# Patient Record
Sex: Male | Born: 1945 | Race: White | Hispanic: No | Marital: Married | Smoking: Current every day smoker
Health system: Southern US, Community
[De-identification: ages and names within clinical notes are randomized; demographics above are authoritative.]

## PROBLEM LIST (undated history)

## (undated) DIAGNOSIS — R351 Nocturia: Secondary | ICD-10-CM

## (undated) DIAGNOSIS — N411 Chronic prostatitis: Secondary | ICD-10-CM

## (undated) DIAGNOSIS — Z860100 Personal history of colon polyps, unspecified: Secondary | ICD-10-CM

## (undated) DIAGNOSIS — B009 Herpesviral infection, unspecified: Secondary | ICD-10-CM

## (undated) DIAGNOSIS — E049 Nontoxic goiter, unspecified: Secondary | ICD-10-CM

## (undated) DIAGNOSIS — J449 Chronic obstructive pulmonary disease, unspecified: Secondary | ICD-10-CM

## (undated) DIAGNOSIS — N400 Enlarged prostate without lower urinary tract symptoms: Secondary | ICD-10-CM

## (undated) DIAGNOSIS — Z87898 Personal history of other specified conditions: Secondary | ICD-10-CM

## (undated) DIAGNOSIS — I700 Atherosclerosis of aorta: Secondary | ICD-10-CM

## (undated) DIAGNOSIS — H269 Unspecified cataract: Secondary | ICD-10-CM

## (undated) DIAGNOSIS — I10 Essential (primary) hypertension: Secondary | ICD-10-CM

## (undated) DIAGNOSIS — C4491 Basal cell carcinoma of skin, unspecified: Secondary | ICD-10-CM

## (undated) DIAGNOSIS — R972 Elevated prostate specific antigen [PSA]: Secondary | ICD-10-CM

## (undated) DIAGNOSIS — E785 Hyperlipidemia, unspecified: Secondary | ICD-10-CM

## (undated) DIAGNOSIS — H8309 Labyrinthitis, unspecified ear: Secondary | ICD-10-CM

## (undated) HISTORY — DX: Nontoxic goiter, unspecified: E04.9

## (undated) HISTORY — DX: Basal cell carcinoma of skin, unspecified: C44.91

## (undated) HISTORY — DX: Nocturia: R35.1

## (undated) HISTORY — DX: Chronic prostatitis: N41.1

## (undated) HISTORY — DX: Hyperlipidemia, unspecified: E78.5

## (undated) HISTORY — DX: Elevated prostate specific antigen (PSA): R97.20

## (undated) HISTORY — DX: Herpesviral infection, unspecified: B00.9

## (undated) HISTORY — PX: EYE SURGERY: SHX253

## (undated) HISTORY — DX: Unspecified cataract: H26.9

## (undated) HISTORY — DX: Personal history of colon polyps, unspecified: Z86.0100

## (undated) HISTORY — PX: SKIN CANCER DESTRUCTION: SHX778

## (undated) HISTORY — DX: Personal history of colonic polyps: Z86.010

## (undated) HISTORY — PX: TONSILLECTOMY: SUR1361

## (undated) HISTORY — DX: Benign prostatic hyperplasia without lower urinary tract symptoms: N40.0

---

## 2006-02-03 ENCOUNTER — Ambulatory Visit: Payer: Self-pay | Admitting: Unknown Physician Specialty

## 2008-12-22 ENCOUNTER — Ambulatory Visit: Payer: Self-pay | Admitting: Internal Medicine

## 2009-08-15 ENCOUNTER — Ambulatory Visit: Payer: Self-pay | Admitting: Internal Medicine

## 2010-12-24 ENCOUNTER — Ambulatory Visit: Payer: Self-pay | Admitting: Gastroenterology

## 2010-12-27 LAB — PATHOLOGY REPORT

## 2012-09-23 ENCOUNTER — Ambulatory Visit: Payer: Self-pay | Admitting: Internal Medicine

## 2013-10-27 ENCOUNTER — Ambulatory Visit: Payer: Self-pay | Admitting: Ophthalmology

## 2013-11-17 ENCOUNTER — Ambulatory Visit: Payer: Self-pay | Admitting: Ophthalmology

## 2014-02-27 DIAGNOSIS — H8309 Labyrinthitis, unspecified ear: Secondary | ICD-10-CM | POA: Insufficient documentation

## 2014-08-16 ENCOUNTER — Other Ambulatory Visit: Payer: Self-pay | Admitting: Urology

## 2014-08-16 DIAGNOSIS — N401 Enlarged prostate with lower urinary tract symptoms: Principal | ICD-10-CM

## 2014-08-16 DIAGNOSIS — N138 Other obstructive and reflux uropathy: Secondary | ICD-10-CM

## 2014-08-17 ENCOUNTER — Ambulatory Visit (INDEPENDENT_AMBULATORY_CARE_PROVIDER_SITE_OTHER): Payer: Medicare Other | Admitting: Urology

## 2014-08-17 ENCOUNTER — Encounter: Payer: Self-pay | Admitting: Urology

## 2014-08-17 VITALS — BP 129/71 | HR 60 | Ht 71.0 in | Wt 176.1 lb

## 2014-08-17 DIAGNOSIS — N401 Enlarged prostate with lower urinary tract symptoms: Secondary | ICD-10-CM | POA: Diagnosis not present

## 2014-08-17 DIAGNOSIS — Z87898 Personal history of other specified conditions: Secondary | ICD-10-CM

## 2014-08-17 DIAGNOSIS — E042 Nontoxic multinodular goiter: Secondary | ICD-10-CM | POA: Insufficient documentation

## 2014-08-17 DIAGNOSIS — N138 Other obstructive and reflux uropathy: Secondary | ICD-10-CM

## 2014-08-17 DIAGNOSIS — C4491 Basal cell carcinoma of skin, unspecified: Secondary | ICD-10-CM | POA: Insufficient documentation

## 2014-08-17 NOTE — Progress Notes (Signed)
08/17/2014 9:49 AM   Ryan Mejia 03-20-1945 [id number]  Referring provider: No referring provider defined for this encounter.  Chief Complaint  Patient presents with  . Benign Prostatic Hypertrophy    6 month follow up  . Nocturia    HPI: Ryan Mejia is a 69 year old white male with BPH with LUTS and a history of elevated PSA who is managed with tamsulosin and finasteride who presents for a 6 month follow up.  His current urinary symptoms consist of urinary frequency, weak stream, straining, and nocturia. He has nocturia that is sporadic happening 1-5 times nightly. He is currently pleased with his urinary symptoms. His IPSS score is 6/1. (mild)  He has not had any fevers, chills, nausea, vomiting, suprapubic pain, lower back pain or gross hematuria since his last visit with [place] 6 months ago.  His father was diagnosed in his 31's with PCa.   PSA History:    5.77 ng/mL on 04/06/2013    3.00 ng/mL on 08/09/2013      IPSS      08/17/14 1300       International Prostate Symptom Score   How often have you had the sensation of not emptying your bladder? Not at All     How often have you had to urinate less than every two hours? Less than half the time     How often have you found you stopped and started again several times when you urinated? Not at All     How often have you found it difficult to postpone urination? Not at All     How often have you had a weak urinary stream? Less than 1 in 5 times     How often have you had to strain to start urination? Less than 1 in 5 times     How many times did you typically get up at night to urinate? 1 Time     Total IPSS Score 5     Quality of Life due to urinary symptoms   If you were to spend the rest of your life with your urinary condition just the way it is now how would you feel about that? Pleased         PMH: Past Medical History  Diagnosis Date  . History of colonic polyps   . Hyperlipidemia   . Goiter   . Skin cancer,  basal cell   . Herpes simplex   . Cataracts, bilateral   . Nocturia   . BPH (benign prostatic hyperplasia)   . Chronic prostatitis   . Elevated PSA     Surgical History: No past surgical history on file.  Home Medications:    Medication List       This list is accurate as of: 08/17/14 11:59 PM.  Always use your most recent med list.               acyclovir 200 MG capsule  Commonly known as:  ZOVIRAX  Take 200 mg by mouth 5 (five) times daily.     aspirin 81 MG tablet  Take 81 mg by mouth daily.     finasteride 5 MG tablet  Commonly known as:  PROSCAR  Take 5 mg by mouth daily.     loratadine 10 MG tablet  Commonly known as:  CLARITIN  Take 10 mg by mouth daily.     PROBIOTIC ADVANCED Caps  Take by mouth.     tamsulosin 0.4 MG Caps capsule  Commonly known as:  FLOMAX  Take 0.4 mg by mouth.        Allergies:  Allergies  Allergen Reactions  . Lipitor [Atorvastatin]     Muscle aches    Family History: Family History  Problem Relation Age of Onset  . Prostate cancer Father   . Cancer Mother     breast cancer    Social History:  reports that he has been smoking Cigarettes.  He has a 20 pack-year smoking history. He does not have any smokeless tobacco history on file. He reports that he drinks about 1.2 oz of alcohol per week. He reports that he does not use illicit drugs.  ROS: Urological Symptom Review  Patient is experiencing the following symptoms: Frequent urination Get up at night to urinate Weak stream   Review of Systems  Gastrointestinal (upper)  : Negative for upper GI symptoms  Gastrointestinal (lower) : Negative for lower GI symptoms  Constitutional : Negative for symptoms  Skin: Negative for skin symptoms  Eyes: Negative for eye symptoms  Ear/Nose/Throat : Negative for Ear/Nose/Throat symptoms  Hematologic/Lymphatic: Negative for Hematologic/Lymphatic symptoms  Cardiovascular : Negative for cardiovascular  symptoms  Respiratory : Negative for respiratory symptoms  Endocrine: Negative for endocrine symptoms  Musculoskeletal: Back pain  Neurological: Negative for neurological symptoms  Psychologic: Negative for psychiatric symptoms   Physical Exam: BP 129/71 mmHg  Pulse 60  Ht 5\' 11"  (1.803 m)  Wt 176 lb 1.6 oz (79.878 kg)  BMI 24.57 kg/m2  GU: Patient with circumcised phallus.   Urethral meatus is patent.  No penile discharge. No penile lesions or rashes. Scrotum without lesions, cysts, rashes and/or edema.  Testicles are located scrotally bilaterally. No masses are appreciated in the testicles. Left and right epididymis are normal.  Rectal: Patient with  normal sphincter tone. Perineum without scarring or rashes. No rectal masses are appreciated. Prostate is approximately 60 grams, no nodules are appreciated. Seminal vesicles are normal.   Laboratory Data: Results for orders placed or performed in visit on 08/17/14  PSA  Result Value Ref Range   Prostate Specific Ag, Serum 1.9 0.0 - 4.0 ng/mL   No results found for: WBC, HGB, HCT, MCV, PLT  No results found for: CREATININE  No results found for: PSA  No results found for: TESTOSTERONE  No results found for: HGBA1C  Urinalysis No results found for: COLORURINE, APPEARANCEUR, LABSPEC, PHURINE, GLUCOSEU, HGBUR, BILIRUBINUR, KETONESUR, PROTEINUR, UROBILINOGEN, NITRITE, LEUKOCYTESUR  Pertinent Imaging:   Assessment & Plan:   1. BPH with obstruction/lower urinary tract symptoms:  Patient's IPSS score is 6/1(mild).  He is pleased with his urinary symptoms.  His DRE demonstrates an enlarged prostate.  We will continue tamsulosin and finasteride.  He will follow up in 6 months for a PSA, DRE, PVR and an IPSS.    - PSA  2. History of elevated PSA:  Patient was referred to [place] 1 year ago for PSA of 5.77 ng/mL by his primary care physician. His most recent PSA was 3.0 ng/mL on 08/09/2013 after a 3 month course of antibiotics.   He was initiated on finasteride at the June visit. We will continue to follow him closely with PSA's and DRE's every 6 months until a stable trend is achieved.   No Follow-up on file.  Ryan Mejia, [facility] [address], [place], [place] [address] [phone]

## 2014-08-18 LAB — PSA: PROSTATE SPECIFIC AG, SERUM: 1.9 ng/mL (ref 0.0–4.0)

## 2014-08-19 ENCOUNTER — Telehealth: Payer: Self-pay

## 2014-08-19 NOTE — Telephone Encounter (Signed)
-----   Message from Nori Riis, PA-C sent at 08/19/2014  8:44 AM EDT ----- PSA is stable.  We will see him 6 months.

## 2014-08-19 NOTE — Telephone Encounter (Signed)
Spoke with pt and made aware of PSA. Pt will f/u in 22mo. Cw,lpn

## 2014-08-22 DIAGNOSIS — Z87898 Personal history of other specified conditions: Secondary | ICD-10-CM | POA: Insufficient documentation

## 2014-08-22 DIAGNOSIS — N138 Other obstructive and reflux uropathy: Secondary | ICD-10-CM | POA: Insufficient documentation

## 2014-08-22 DIAGNOSIS — N401 Enlarged prostate with lower urinary tract symptoms: Secondary | ICD-10-CM

## 2015-01-20 ENCOUNTER — Other Ambulatory Visit: Payer: Self-pay | Admitting: Internal Medicine

## 2015-01-20 DIAGNOSIS — R0602 Shortness of breath: Secondary | ICD-10-CM

## 2015-01-27 ENCOUNTER — Ambulatory Visit
Admission: RE | Admit: 2015-01-27 | Discharge: 2015-01-27 | Disposition: A | Discharge status: Home / Self Care | Payer: Medicare Other | Source: Ambulatory Visit | Attending: Internal Medicine | Admitting: Internal Medicine

## 2015-01-27 DIAGNOSIS — R0602 Shortness of breath: Secondary | ICD-10-CM | POA: Insufficient documentation

## 2015-01-27 DIAGNOSIS — J439 Emphysema, unspecified: Secondary | ICD-10-CM | POA: Diagnosis not present

## 2015-01-27 DIAGNOSIS — D3502 Benign neoplasm of left adrenal gland: Secondary | ICD-10-CM | POA: Insufficient documentation

## 2015-01-27 DIAGNOSIS — E041 Nontoxic single thyroid nodule: Secondary | ICD-10-CM | POA: Diagnosis not present

## 2015-01-27 DIAGNOSIS — D3501 Benign neoplasm of right adrenal gland: Secondary | ICD-10-CM | POA: Insufficient documentation

## 2015-02-13 ENCOUNTER — Other Ambulatory Visit: Payer: Medicare Other

## 2015-02-13 DIAGNOSIS — R972 Elevated prostate specific antigen [PSA]: Secondary | ICD-10-CM

## 2015-02-14 LAB — PSA: PROSTATE SPECIFIC AG, SERUM: 1.7 ng/mL (ref 0.0–4.0)

## 2015-02-15 ENCOUNTER — Encounter: Payer: Self-pay | Admitting: Urology

## 2015-02-15 ENCOUNTER — Ambulatory Visit (INDEPENDENT_AMBULATORY_CARE_PROVIDER_SITE_OTHER): Payer: Medicare Other | Admitting: Urology

## 2015-02-15 ENCOUNTER — Ambulatory Visit: Payer: Medicare Other | Admitting: Urology

## 2015-02-15 VITALS — BP 120/69 | HR 83 | Ht 72.0 in | Wt 182.6 lb

## 2015-02-15 DIAGNOSIS — N401 Enlarged prostate with lower urinary tract symptoms: Secondary | ICD-10-CM

## 2015-02-15 DIAGNOSIS — Z87898 Personal history of other specified conditions: Secondary | ICD-10-CM

## 2015-02-15 DIAGNOSIS — N138 Other obstructive and reflux uropathy: Secondary | ICD-10-CM

## 2015-02-15 MED ORDER — TAMSULOSIN HCL 0.4 MG PO CAPS: 0.4000 mg | ORAL_CAPSULE | Freq: Every day | ORAL | Status: DC

## 2015-02-15 MED ORDER — FINASTERIDE 5 MG PO TABS: 5.0000 mg | ORAL_TABLET | Freq: Every day | ORAL | Status: DC

## 2015-02-15 NOTE — Progress Notes (Signed)
8:Ryan Mejia 1946-01-11 [id number]  Referring provider: Idelle Crouch, MD [facility] [place], [place] [address]  Chief Complaint  Patient presents with  . Elevated PSA    6 month followup  . Benign Prostatic Hypertrophy    HPI: Patient is a 69 year old white male with a history of elevated PSA and BPH with LUTS who presents today for a 6 month follow up.  BPH WITH LUTS His IPSS score today is 13, which is moderate lower urinary tract symptomatology. He is mostly dissatisfied with his quality life due to his urinary symptoms.  His major complaint today is worsening of his urinary symptoms.  He has had these symptoms for the last 6 to 8 weeks.  He has noticed that his finasteride medication looks different from his previous prescription.  He believes that this may be the cause of his worsening urinary symptoms.    He denies any dysuria, hematuria or suprapubic pain.   He also denies any recent fevers, chills, nausea or vomiting.   He has a family history of PCa, with his father being diagnosed with prostate cancer.        IPSS      02/15/15 1600       International Prostate Symptom Score   How often have you had the sensation of not emptying your bladder? Less than 1 in 5     How often have you had to urinate less than every two hours? More than half the time     How often have you found you stopped and started again several times when you urinated? Not at All     How often have you found it difficult to postpone urination? Less than 1 in 5 times     How often have you had a weak urinary stream? More than half the time     How often have you had to strain to start urination? Not at All     How many times did you typically get up at night to urinate? 3 Times     Total IPSS Score 13     Quality of Life due to urinary symptoms   If you were to spend the rest of your life with your urinary condition just the way it is now how would you feel  about that? Mostly Disatisfied        Score:  1-7 Mild 8-19 Moderate 20-35 Severe   History of elevated  PSA Patient was referred to [place] in 2015  for PSA of 5.77 ng/mL by his primary care physician.  He was initiated on finasteride at that time.  His most recent PSA was 1.7 ng/mL on 02/13/2015.     PMH: Past Medical History  Diagnosis Date  . History of colonic polyps   . Hyperlipidemia   . Goiter   . Skin cancer, basal cell   . Herpes simplex   . Cataracts, bilateral   . Nocturia   . BPH (benign prostatic hyperplasia)   . Chronic prostatitis   . Elevated PSA     Surgical History: Past Surgical History  Procedure Laterality Date  . Tonsillectomy      child    Home Medications:    Medication List       This list is accurate as of: 02/15/15 11:59 PM.  Always use your most recent med list.  acyclovir 200 MG capsule  Commonly known as:  ZOVIRAX  Take 200 mg by mouth 5 (five) times daily. Reported on 02/15/2015     acyclovir 400 MG tablet  Commonly known as:  ZOVIRAX     aspirin 81 MG tablet  Take 81 mg by mouth daily. Reported on 02/15/2015     finasteride 5 MG tablet  Commonly known as:  PROSCAR  Take 1 tablet (5 mg total) by mouth daily.     loratadine 10 MG tablet  Commonly known as:  CLARITIN  Take 10 mg by mouth daily.     PROBIOTIC ADVANCED Caps  Take by mouth.     tamsulosin 0.4 MG Caps capsule  Commonly known as:  FLOMAX  Take 1 capsule (0.4 mg total) by mouth daily.        Allergies:  Allergies  Allergen Reactions  . Lipitor [Atorvastatin]     Muscle aches    Family History: Family History  Problem Relation Age of Onset  . Prostate cancer Father   . Breast cancer Mother   . Kidney disease Neg Hx     Social History:  reports that he has been smoking Cigarettes.  He has a 20 pack-year smoking history. He does not have any smokeless tobacco history on file. He reports that he drinks about 1.2 oz of alcohol per  week. He reports that he does not use illicit drugs.  ROS: Urological Symptom Review  Patient is experiencing the following symptoms: Frequent urination Get up at night to urinate Weak stream   Review of Systems  Gastrointestinal (upper)  : Negative for upper GI symptoms  Gastrointestinal (lower) : Negative for lower GI symptoms  Constitutional : Negative for symptoms  Skin: Negative for skin symptoms  Eyes: Negative for eye symptoms  Ear/Nose/Throat : Negative for Ear/Nose/Throat symptoms  Hematologic/Lymphatic: Negative for Hematologic/Lymphatic symptoms  Cardiovascular : Negative for cardiovascular symptoms  Respiratory : Negative for respiratory symptoms  Endocrine: Negative for endocrine symptoms  Musculoskeletal: Back pain  Neurological: Negative for neurological symptoms  Psychologic: Negative for psychiatric symptoms   Physical Exam: BP 120/69 mmHg  Pulse 83  Ht 6' (1.829 m)  Wt 182 lb 9.6 oz (82.827 kg)  BMI 24.76 kg/m2  GU: Patient with circumcised phallus.   Urethral meatus is patent.  No penile discharge. No penile lesions or rashes. Scrotum without lesions, cysts, rashes and/or edema.  Testicles are located scrotally bilaterally. No masses are appreciated in the testicles. Left and right epididymis are normal. Rectal: Patient with  normal sphincter tone. Perineum without scarring or rashes. No rectal masses are appreciated. Prostate is approximately 60 grams, no nodules are appreciated. Seminal vesicles are normal.   Laboratory Data: PSA History:    5.77 ng/mL on 04/06/2013    3.00 ng/mL on 08/09/2013  Lab Results  Component Value Date   PSA 1.7 02/13/2015   PSA 1.9 08/17/2014   Pertinent Imaging:   Assessment & Plan:   1. BPH with obstruction/lower urinary tract symptoms:  Patient's IPSS score is 13/4.  He is mostly dissatisfied with his urinary symptoms at this time.  We have contacted his pharmacy and they have changed  manufacturers.  His pharmacy has stated that he cannot go back to the original generic manufacturers.  I will prescribe branded Proscar and Flomax.  Patient will contact us regarding his ability to fill those scripts.   He will follow up in 6 months for a PSA, exam and an IPSS score.  2. History of elevated PSA:  Patient was referred to [place] 1 year ago for PSA of 5.77 ng/mL by his primary care physician. His most recent PSA was 1.7 ng/mL on 02/13/2015.   He was initiated on finasteride at the June 2016 visit. We will continue to follow him closely with PSA's and DRE's every 6 months until a stable trend is achieved.   Return in about 6 months (around 08/16/2015) for IPSS score and exam.  Zara Council, [facility] [address], [place], [place] [address] [phone]

## 2015-03-04 ENCOUNTER — Ambulatory Visit
Admission: EM | Admit: 2015-03-04 | Discharge: 2015-03-04 | Disposition: A | Discharge status: Home / Self Care | Payer: Medicare Other | Attending: Family Medicine | Admitting: Family Medicine

## 2015-03-04 ENCOUNTER — Encounter: Payer: Self-pay | Admitting: Emergency Medicine

## 2015-03-04 DIAGNOSIS — J069 Acute upper respiratory infection, unspecified: Secondary | ICD-10-CM

## 2015-03-04 LAB — RAPID STREP SCREEN (MED CTR MEBANE ONLY): Streptococcus, Group A Screen (Direct): NEGATIVE

## 2015-03-04 MED ORDER — AZITHROMYCIN 250 MG PO TABS
ORAL_TABLET | ORAL | Status: DC
Start: 2015-03-04 — End: 2015-08-17

## 2015-03-04 NOTE — ED Provider Notes (Signed)
Patient presents today with symptoms of nasal congestion, sore throat, mild productive cough. He states that he has had the symptoms for the last 4 days. He denies any high fever, chest pain, shortness of breath, nausea, vomiting, abdominal pain, severe headache. He does smoke. He denies any history of asthma or COPD.  ROS: Negative except mentioned above.  Vitals as per Epic. GENERAL: NAD HEENT: mild pharyngeal erythema, no exudate, +cerumen bilaterally, no significant cervical LAD RESP: CTA B CARD: RRR NEURO: CN II-XII grossly intact   A/P: URI, Nasal Congestion- Will treat with Z-Pak, Claritin when necessary, Delsym when necessary, rest, hydration, seek medical attention if symptoms persist or worsen as discussed. Encouraged smoking cessation.   Paulina Fusi, MD 03/04/15 [phone]

## 2015-03-04 NOTE — ED Notes (Signed)
Patient c/o runny nose, sinus pressure, cough and chest congestion and sore throat since Wed.  Patient denies fevers.

## 2015-03-06 LAB — CULTURE, GROUP A STREP (THRC)

## 2015-06-03 ENCOUNTER — Encounter: Payer: Self-pay | Admitting: Gynecology

## 2015-06-03 ENCOUNTER — Ambulatory Visit
Admission: EM | Admit: 2015-06-03 | Discharge: 2015-06-03 | Disposition: A | Discharge status: Home / Self Care | Payer: Medicare Other | Attending: Family Medicine | Admitting: Family Medicine

## 2015-06-03 DIAGNOSIS — J111 Influenza due to unidentified influenza virus with other respiratory manifestations: Secondary | ICD-10-CM | POA: Diagnosis not present

## 2015-06-03 DIAGNOSIS — Z720 Tobacco use: Secondary | ICD-10-CM | POA: Diagnosis not present

## 2015-06-03 LAB — RAPID INFLUENZA A&B ANTIGENS: Influenza A (ARMC): NEGATIVE

## 2015-06-03 LAB — RAPID STREP SCREEN (MED CTR MEBANE ONLY): STREPTOCOCCUS, GROUP A SCREEN (DIRECT): NEGATIVE

## 2015-06-03 LAB — RAPID INFLUENZA A&B ANTIGENS (ARMC ONLY): INFLUENZA B (ARMC): NEGATIVE

## 2015-06-03 MED ORDER — FEXOFENADINE-PSEUDOEPHED ER 180-240 MG PO TB24: 1.0000 | ORAL_TABLET | Freq: Every day | ORAL | Status: DC

## 2015-06-03 MED ORDER — OSELTAMIVIR PHOSPHATE 75 MG PO CAPS: 75.0000 mg | ORAL_CAPSULE | Freq: Two times a day (BID) | ORAL | Status: DC

## 2015-06-03 MED ORDER — MELOXICAM 15 MG PO TABS: 15.0000 mg | ORAL_TABLET | Freq: Every day | ORAL | Status: DC

## 2015-06-03 MED ORDER — HYDROCOD POLST-CPM POLST ER 10-8 MG/5ML PO SUER: 5.0000 mL | Freq: Two times a day (BID) | ORAL | Status: DC | PRN

## 2015-06-03 NOTE — ED Notes (Signed)
Patient c/o body cache / low grade fever / fever this am of 100.1 / cough and head ache

## 2015-06-03 NOTE — ED Provider Notes (Signed)
CSN: [id number]     Arrival date & time 06/03/15  [id number] History   First MD Initiated Contact with Patient 06/03/15 [phone]    Nurses notes were reviewed. Chief Complaint  Patient presents with  . Cough  . Generalized Body Aches      Patient reports slight cough Thursday night aches started on Friday along the coughing sore throat overall general malaise. He received his flu shot early in the year  Does have a history of smoking. No pertinent family medical history but he's had history of colonic polyps skin cancers herpes simplex and BPH and chronic prostatitis.    (Consider location/radiation/quality/duration/timing/severity/associated sxs/prior Treatment) Patient is a 70 y.o. male presenting with cough. The history is provided by the patient. No language interpreter was used.  Cough Cough characteristics:  Productive Severity:  Moderate Duration:  2 days Progression:  Worsening Chronicity:  New Context: upper respiratory infection   Associated symptoms: chills, ear pain, fever, myalgias, rhinorrhea and sore throat   Associated symptoms: no wheezing     Past Medical History  Diagnosis Date  . History of colonic polyps   . Hyperlipidemia   . Goiter   . Skin cancer, basal cell   . Herpes simplex   . Cataracts, bilateral   . Nocturia   . BPH (benign prostatic hyperplasia)   . Chronic prostatitis   . Elevated PSA    Past Surgical History  Procedure Laterality Date  . Tonsillectomy      child   Family History  Problem Relation Age of Onset  . Prostate cancer Father   . Breast cancer Mother   . Kidney disease Neg Hx    Social History  Substance Use Topics  . Smoking status: Current Every Day Smoker -- 0.50 packs/day for 40 years    Types: Cigarettes  . Smokeless tobacco: None  . Alcohol Use: 1.2 oz/week    2 Cans of beer per week    Review of Systems  Constitutional: Positive for fever and chills.  HENT: Positive for congestion, ear pain, rhinorrhea, sinus  pressure and sore throat.   Eyes: Negative for visual disturbance.  Respiratory: Positive for cough. Negative for wheezing.   Musculoskeletal: Positive for myalgias.  All other systems reviewed and are negative.   Allergies  Lipitor  Home Medications   Prior to Admission medications   Medication Sig Start Date End Date Taking? Authorizing Provider  acyclovir (ZOVIRAX) 400 MG tablet  12/06/14  Yes Historical Provider, MD  aspirin 81 MG tablet Take 81 mg by mouth daily. Reported on 02/15/2015   Yes Historical Provider, MD  finasteride (PROSCAR) 5 MG tablet Take 1 tablet (5 mg total) by mouth daily. 02/15/15  Yes Shannon A McGowan, PA-C  loratadine (CLARITIN) 10 MG tablet Take 10 mg by mouth daily.   Yes Historical Provider, MD  Probiotic Product (PROBIOTIC ADVANCED) CAPS Take by mouth.   Yes Historical Provider, MD  tamsulosin (FLOMAX) 0.4 MG CAPS capsule Take 1 capsule (0.4 mg total) by mouth daily. 02/15/15  Yes Shannon A McGowan, PA-C  acyclovir (ZOVIRAX) 200 MG capsule Take 200 mg by mouth 5 (five) times daily. Reported on 02/15/2015    Historical Provider, MD  azithromycin (ZITHROMAX Z-PAK) 250 MG tablet Use as directed for 5 days. 03/04/15   Paulina Fusi, MD  chlorpheniramine-HYDROcodone (TUSSIONEX PENNKINETIC ER) 10-8 MG/5ML SUER Take 5 mLs by mouth every 12 (twelve) hours as needed for cough. 06/03/15   Frederich Cha, MD  fexofenadine-pseudoephedrine (ALLEGRA-D ALLERGY & CONGESTION)  180-240 MG 24 hr tablet Take 1 tablet by mouth daily. 06/03/15   Frederich Cha, MD  meloxicam (MOBIC) 15 MG tablet Take 1 tablet (15 mg total) by mouth daily. 06/03/15   Frederich Cha, MD  oseltamivir (TAMIFLU) 75 MG capsule Take 1 capsule (75 mg total) by mouth 2 (two) times daily. 06/03/15   Frederich Cha, MD   Meds Ordered and Administered this Visit  Medications - No data to display  BP 134/64 mmHg  Pulse 84  Temp(Src) 100 F (37.8 C) (Oral)  Resp 16  Ht 6' (1.829 m)  Wt 170 lb (77.111 kg)  BMI 23.05 kg/m2   SpO2 99% No data found.   Physical Exam  Constitutional: He is oriented to person, place, and time. He appears well-developed and well-nourished.  HENT:  Head: Normocephalic and atraumatic.  Right Ear: External ear normal.  Left Ear: External ear normal.  Eyes: Conjunctivae are normal. Pupils are equal, round, and reactive to light.  Neck: Normal range of motion. Neck supple.  Cardiovascular: Normal rate, regular rhythm and normal heart sounds.   Pulmonary/Chest: Effort normal and breath sounds normal.  Musculoskeletal: Normal range of motion. He exhibits no edema.  Neurological: He is alert and oriented to person, place, and time.  Skin: Skin is warm and dry.  Psychiatric: He has a normal mood and affect.  Vitals reviewed.   ED Course  Procedures (including critical care time)  Labs Review Labs Reviewed  RAPID INFLUENZA A&B ANTIGENS ([facility] ONLY)  RAPID STREP SCREEN (NOT AT [facility])  CULTURE, GROUP A STREP [facility])    Imaging Review No results found.   Visual Acuity Review  Right Eye Distance:   Left Eye Distance:   Bilateral Distance:    Right Eye Near:   Left Eye Near:    Bilateral Near:      Results for orders placed or performed during the hospital encounter of 06/03/15  Rapid Influenza A&B Antigens ([facility] only)  Result Value Ref Range   Influenza A ([facility]) NEGATIVE NEGATIVE   Influenza B ([facility]) NEGATIVE NEGATIVE  Rapid strep screen  Result Value Ref Range   Streptococcus, Group A Screen (Direct) NEGATIVE NEGATIVE     MDM   1. Flu syndrome   2. Tobacco abuse    Patient symptoms. 20-36 hours flu test was negative but will see Tamiflu 75 mg twice a day plus next 1 teaspoon twice a day Allegra-D decongestant daily and Mobic 50 mg daily. He inquires whether he can take aspirin strongly discouraged aspirin use and use of muscle aches and pain. Can also use Tylenol if needed and follow-up Dr. Doy Hutching not better in 3-4 days. *Encouraged him to stop smoking  deformity does not need a work note.   Note: This dictation was prepared with Dragon dictation along with smaller phrase technology. Any transcriptional errors that result from this process are unintentional.   Frederich Cha, MD 06/03/15 [phone]

## 2015-06-03 NOTE — Discharge Instructions (Signed)
Influenza, Adult Influenza (flu) is an infection in the mouth, nose, and throat (respiratory tract) caused by a virus. The flu can make you feel very ill. Influenza spreads easily from person to person (contagious).  HOME CARE   Only take medicines as told by your doctor.  Use a cool mist humidifier to make breathing easier.  Get plenty of rest until your fever goes away. This usually takes 3 to 4 days.  Drink enough fluids to keep your pee (urine) clear or pale yellow.  Cover your mouth and nose when you cough or sneeze.  Wash your hands well to avoid spreading the flu.  Stay home from work or school until your fever has been gone for at least 1 full day.  Get a flu shot every year. GET HELP RIGHT AWAY IF:   You have trouble breathing or feel short of breath.  Your skin or nails turn blue.  You have severe neck pain or stiffness.  You have a severe headache, facial pain, or earache.  Your fever gets worse or keeps coming back.  You feel sick to your stomach (nauseous), throw up (vomit), or have watery poop (diarrhea).  You have chest pain.  You have a deep cough that gets worse, or you cough up more thick spit (mucus). MAKE SURE YOU:   Understand these instructions.  Will watch your condition.  Will get help right away if you are not doing well or get worse.   This information is not intended to replace advice given to you by your health care provider. Make sure you discuss any questions you have with your health care provider.   Document Released: 11/28/2007 Document Revised: 03/11/2014 Document Reviewed: 05/20/2011 Elsevier Interactive Patient Education 2016 Elsevier Inc. Influenza Tests WHY AM I HAVING THIS TEST? You may have an influenza test to help your health care provider determine what type of respiratory infection you have. The test may also be used to help determine a treatment plan and to monitor influenza activity within a community. There are two  types of influenza virus: types A and B. Often, one strain of type A influenza will be the most common type of influenza in a community during flu season. This is typically between the months of October and May. Influenza tests can help determine which strain of influenza type A is occurring most often in the community. WHAT KIND OF SAMPLE IS TAKEN? Influenza tests are performed by collecting a small sample of fluids (secretions) from your nose or throat using a cotton swab. Tests performed on nasal secretions are more accurate than tests performed on a sample taken from your throat.  Rapid influenza tests are available and have become the most frequently used tests for influenza. They are most accurate when completed within the first 48 hours after your symptoms begin.  Depending on the method, a rapid influenza test may be completed in your health care provider's office in less than 30 minutes. It can also be sent to a lab with the results available the same day.  Depending on the particular type of test used, it can identify influenza type A, a mixture of types A and B, or differentiate between type A and B.  Another test that your health care provider may order is a viral culture. This also requires the collection of secretions from your nose or throat. The sample is then sent to a lab for processing. This may take several days to complete. HOW ARE YOUR TEST RESULTS  REPORTED? Your test results will be reported as either positive or negative. A false-negative result can occur. A false-negative result is incorrect because it indicates a condition or finding is not present when it is. It is your responsibility to obtain your test results. Ask the lab or department performing the test when and how you will get your results. WHAT DO THE RESULTS MEAN?  A positive test means you have influenza. Tests may further determine the type of influenza you have.  A negative influenza test result means it is  not likely that you have influenza.  A false-negative result can occur. False-negative results are more likely to happen at the height of the influenza season. Talk with your health care provider to discuss your results, treatment options, and if necessary, the need for more tests. Talk with your health care provider if you have any questions about your results.   This information is not intended to replace advice given to you by your health care provider. Make sure you discuss any questions you have with your health care provider.   Document Released: 11/28/2004 Document Revised: 03/11/2014 Document Reviewed: 07/07/2013 Elsevier Interactive Patient Education [employer].

## 2015-06-06 LAB — CULTURE, GROUP A STREP (THRC)

## 2015-07-06 ENCOUNTER — Other Ambulatory Visit: Payer: Self-pay | Admitting: Internal Medicine

## 2015-07-06 DIAGNOSIS — M542 Cervicalgia: Principal | ICD-10-CM

## 2015-07-06 DIAGNOSIS — G8929 Other chronic pain: Secondary | ICD-10-CM

## 2015-07-26 ENCOUNTER — Other Ambulatory Visit: Payer: Self-pay | Admitting: Internal Medicine

## 2015-07-26 ENCOUNTER — Ambulatory Visit
Admission: RE | Admit: 2015-07-26 | Discharge: 2015-07-26 | Disposition: A | Discharge status: Home / Self Care | Payer: Medicare Other | Source: Ambulatory Visit | Attending: Internal Medicine | Admitting: Internal Medicine

## 2015-07-26 DIAGNOSIS — M50322 Other cervical disc degeneration at C5-C6 level: Secondary | ICD-10-CM | POA: Diagnosis not present

## 2015-07-26 DIAGNOSIS — M47812 Spondylosis without myelopathy or radiculopathy, cervical region: Secondary | ICD-10-CM | POA: Diagnosis not present

## 2015-07-26 DIAGNOSIS — M542 Cervicalgia: Secondary | ICD-10-CM | POA: Insufficient documentation

## 2015-07-26 DIAGNOSIS — G8929 Other chronic pain: Secondary | ICD-10-CM

## 2015-07-26 DIAGNOSIS — M50321 Other cervical disc degeneration at C4-C5 level: Secondary | ICD-10-CM | POA: Insufficient documentation

## 2015-07-26 DIAGNOSIS — M50323 Other cervical disc degeneration at C6-C7 level: Secondary | ICD-10-CM | POA: Diagnosis not present

## 2015-07-26 DIAGNOSIS — E079 Disorder of thyroid, unspecified: Secondary | ICD-10-CM | POA: Diagnosis not present

## 2015-08-02 ENCOUNTER — Ambulatory Visit: Payer: Medicare Other | Attending: Specialist

## 2015-08-02 DIAGNOSIS — G4733 Obstructive sleep apnea (adult) (pediatric): Secondary | ICD-10-CM | POA: Diagnosis not present

## 2015-08-02 DIAGNOSIS — G4761 Periodic limb movement disorder: Secondary | ICD-10-CM | POA: Insufficient documentation

## 2015-08-17 ENCOUNTER — Ambulatory Visit (INDEPENDENT_AMBULATORY_CARE_PROVIDER_SITE_OTHER): Payer: Medicare Other | Admitting: Urology

## 2015-08-17 ENCOUNTER — Encounter: Payer: Self-pay | Admitting: Urology

## 2015-08-17 VITALS — BP 122/76 | HR 67 | Ht 72.0 in | Wt 179.0 lb

## 2015-08-17 DIAGNOSIS — Z8042 Family history of malignant neoplasm of prostate: Secondary | ICD-10-CM

## 2015-08-17 DIAGNOSIS — N138 Other obstructive and reflux uropathy: Secondary | ICD-10-CM

## 2015-08-17 DIAGNOSIS — N401 Enlarged prostate with lower urinary tract symptoms: Secondary | ICD-10-CM

## 2015-08-17 DIAGNOSIS — Z87898 Personal history of other specified conditions: Secondary | ICD-10-CM

## 2015-08-17 NOTE — Progress Notes (Signed)
10:50 AM   Ryan Mejia 03/08/1945 [id number]  Referring provider: Idelle Crouch, MD [place] [facility] [place], [place] [address]  Chief Complaint  Patient presents with  . Follow-up  . Elevated PSA    HPI: Patient is a 70 year old Caucasian male with a history of elevated PSA and BPH with LUTS who presents today for a 6 month follow up.  BPH WITH LUTS His IPSS score today is 16, which is moderate lower urinary tract symptomatology. He is mixed with his quality life due to his urinary symptoms.  His major complaint today is worsening of his urinary symptoms.  He has had these symptoms for the last 6 months.  He did not notice an improvement with the branded finasteride and tamsulosin.  He denies any dysuria, hematuria or suprapubic pain.   He also denies any recent fevers, chills, nausea or vomiting.   He has a family history of PCa, with his father being diagnosed with prostate cancer.        IPSS      08/17/15 1000       International Prostate Symptom Score   How often have you had the sensation of not emptying your bladder? Almost always     How often have you had to urinate less than every two hours? About half the time     How often have you found you stopped and started again several times when you urinated? Not at All     How often have you found it difficult to postpone urination? Less than 1 in 5 times     How often have you had a weak urinary stream? More than half the time     How often have you had to strain to start urination? Less than 1 in 5 times     How many times did you typically get up at night to urinate? 2 Times     Total IPSS Score 16     Quality of Life due to urinary symptoms   If you were to spend the rest of your life with your urinary condition just the way it is now how would you feel about that? Mixed        Score:  1-7 Mild 8-19 Moderate 20-35 Severe   History of elevated  PSA Patient was referred to [place] in 2015   for PSA of 5.77 ng/mL by his primary care physician.  He was initiated on finasteride at that time.  His most recent PSA was 1.7 ng/mL on 02/13/2015.     PMH: Past Medical History  Diagnosis Date  . History of colonic polyps   . Hyperlipidemia   . Goiter   . Skin cancer, basal cell   . Herpes simplex   . Cataracts, bilateral   . Nocturia   . BPH (benign prostatic hyperplasia)   . Chronic prostatitis   . Elevated PSA     Surgical History: Past Surgical History  Procedure Laterality Date  . Tonsillectomy      child    Home Medications:    Medication List       This list is accurate as of: 08/17/15 10:50 AM.  Always use your most recent med list.               acyclovir 200 MG capsule  Commonly known as:  ZOVIRAX  Take 200 mg by mouth 5 (five) times daily. Reported on 02/15/2015  acyclovir 400 MG tablet  Commonly known as:  ZOVIRAX     aspirin 81 MG tablet  Take 81 mg by mouth daily. Reported on 02/15/2015     finasteride 5 MG tablet  Commonly known as:  PROSCAR  Take 1 tablet (5 mg total) by mouth daily.     PROBIOTIC ADVANCED Caps  Take by mouth.     tamsulosin 0.4 MG Caps capsule  Commonly known as:  FLOMAX  Take 1 capsule (0.4 mg total) by mouth daily.        Allergies:  Allergies  Allergen Reactions  . Lipitor [Atorvastatin]     Muscle aches    Family History: Family History  Problem Relation Age of Onset  . Prostate cancer Father   . Breast cancer Mother   . Kidney disease Neg Hx     Social History:  reports that he has been smoking Cigarettes.  He has a 20 pack-year smoking history. He does not have any smokeless tobacco history on file. He reports that he drinks about 1.2 oz of alcohol per week. He reports that he does not use illicit drugs.  ROS: Urological Symptom Review  Patient is experiencing the following symptoms: Frequent urination Get up at night to urinate Weak stream   Review of Systems  Gastrointestinal  (upper)  : Negative for upper GI symptoms  Gastrointestinal (lower) : Negative for lower GI symptoms  Constitutional : Negative for symptoms  Skin: Negative for skin symptoms  Eyes: Negative for eye symptoms  Ear/Nose/Throat : Negative for Ear/Nose/Throat symptoms  Hematologic/Lymphatic: Negative for Hematologic/Lymphatic symptoms  Cardiovascular : Negative for cardiovascular symptoms  Respiratory : Negative for respiratory symptoms  Endocrine: Negative for endocrine symptoms  Musculoskeletal: Back pain  Neurological: Negative for neurological symptoms  Psychologic: Negative for psychiatric symptoms   Physical Exam: BP 122/76 mmHg  Pulse 67  Ht 6' (1.829 m)  Wt 179 lb (81.194 kg)  BMI 24.27 kg/m2  GU: Patient with circumcised phallus.   Urethral meatus is patent.  No penile discharge. No penile lesions or rashes. Scrotum without lesions, cysts, rashes and/or edema.  Testicles are located scrotally bilaterally. No masses are appreciated in the testicles. Left and right epididymis are normal. Rectal: Patient with  normal sphincter tone. Perineum without scarring or rashes. No rectal masses are appreciated. Prostate is approximately 60 grams, no nodules are appreciated. Seminal vesicles are normal.   Laboratory Data: PSA History:    5.77 ng/mL on 04/06/2013    3.00 ng/mL on 08/09/2013    1.9 ng/mL on 08/17/2014    1.7 ng/mL on 02/13/2015  Assessment & Plan:   1. BPH with obstruction/lower urinary tract symptoms:  Patient's IPSS score is 16/3.  He is mixed with his urinary symptoms at this time.  The branded Proscar and Flomax did not provide relief.  I have given him 4 weeks samples of Rapaflo to see if this is more effective than the tamsulosin.  He will contact us if he desires to continue the Rapaflo.  He is not interested in a surgical procedure at this time.  He will follow up in 6 months for a PSA, exam and an IPSS score.    2. History of elevated PSA:   Patient was referred to [place] 1 year ago for PSA of 5.77 ng/mL by his primary care physician. His most recent PSA was 1.7 ng/mL on 02/13/2015.   He was initiated on finasteride at the June 2016 visit. We will continue to follow him  closely with PSA's and DRE's every 6 months until a stable trend is achieved.  3. Family history of prostate cancer:   Father with prostate cancer.  See above.     Return in about 6 months (around 02/16/2016) for IPSS, exam and PSA.  Zara Council, [facility] [address] [place], [place] [address] [phone]

## 2015-08-18 ENCOUNTER — Telehealth: Payer: Self-pay

## 2015-08-18 LAB — PSA: Prostate Specific Ag, Serum: 1.9 ng/mL (ref 0.0–4.0)

## 2015-08-18 NOTE — Telephone Encounter (Signed)
Spoke with pt in reference to PSA results. Pt voiced understanding.  

## 2015-08-18 NOTE — Telephone Encounter (Signed)
-----   Message from Nori Riis, PA-C sent at 08/18/2015  7:53 AM EDT ----- PSA is stable.  We will see him in December.

## 2015-08-21 DIAGNOSIS — Z8042 Family history of malignant neoplasm of prostate: Secondary | ICD-10-CM | POA: Insufficient documentation

## 2015-10-20 ENCOUNTER — Other Ambulatory Visit: Payer: Self-pay | Admitting: Internal Medicine

## 2015-10-20 DIAGNOSIS — R1084 Generalized abdominal pain: Secondary | ICD-10-CM

## 2015-11-03 ENCOUNTER — Ambulatory Visit
Admission: RE | Admit: 2015-11-03 | Discharge: 2015-11-03 | Disposition: A | Discharge status: Home / Self Care | Payer: Medicare Other | Source: Ambulatory Visit | Attending: Internal Medicine | Admitting: Internal Medicine

## 2015-11-03 DIAGNOSIS — D3501 Benign neoplasm of right adrenal gland: Secondary | ICD-10-CM | POA: Diagnosis not present

## 2015-11-03 DIAGNOSIS — R195 Other fecal abnormalities: Secondary | ICD-10-CM | POA: Diagnosis not present

## 2015-11-03 DIAGNOSIS — I700 Atherosclerosis of aorta: Secondary | ICD-10-CM | POA: Diagnosis not present

## 2015-11-03 DIAGNOSIS — R1084 Generalized abdominal pain: Secondary | ICD-10-CM | POA: Diagnosis present

## 2015-11-03 MED ORDER — IOPAMIDOL (ISOVUE-300) INJECTION 61%
100.0000 mL | Freq: Once | INTRAVENOUS | Status: AC | PRN
  Administered 2015-11-03: 100 mL via INTRAVENOUS

## 2015-11-26 ENCOUNTER — Other Ambulatory Visit: Payer: Self-pay | Admitting: Urology

## 2016-02-13 ENCOUNTER — Other Ambulatory Visit: Payer: Medicare Other

## 2016-02-15 ENCOUNTER — Ambulatory Visit: Payer: Medicare Other | Admitting: Urology

## 2016-02-19 NOTE — Progress Notes (Signed)
9:20 AM   Ryan Mejia January 13, 1946 [id number]  Referring provider: Idelle Crouch, MD [facility] [place], [place] [address]  Chief Complaint  Patient presents with  . Benign Prostatic Hypertrophy    6 month follow up  . Elevated PSA    HPI: Patient is a 70 year old Caucasian male with a history of elevated PSA and BPH with LUTS who presents today for a 6 month follow up.  BPH WITH LUTS His IPSS score today is 12, which is moderate lower urinary tract symptomatology. He is mostly satisfied with his quality life due to his urinary symptoms.  His PVR is 27 mL.  His previous I PSS score was 16/3 .  His major complaint today is worsening of his urinary symptoms.  He has had these symptoms for the last 6 months.  He did not notice an improvement with the branded finasteride and tamsulosin.  He denies any dysuria, hematuria or suprapubic pain.   He also denies any recent fevers, chills, nausea or vomiting.   He has a family history of PCa, with his father being diagnosed with prostate cancer.        IPSS    Row Name 02/20/16 0900         International Prostate Symptom Score   How often have you had the sensation of not emptying your bladder? Not at All     How often have you had to urinate less than every two hours? Almost always     How often have you found you stopped and started again several times when you urinated? Not at All     How often have you found it difficult to postpone urination? Less than half the time     How often have you had a weak urinary stream? About half the time     How often have you had to strain to start urination? Not at All     How many times did you typically get up at night to urinate? 2 Times     Total IPSS Score 12       Quality of Life due to urinary symptoms   If you were to spend the rest of your life with your urinary condition just the way it is now how would you feel about that? Mostly Satisfied         Score:  1-7 Mild 8-19 Moderate 20-35 Severe   History of elevated  PSA Patient was referred to [place] in 2015  for PSA of 5.77 ng/mL by his primary care physician.  He was initiated on finasteride at that time.  His most recent PSA was 2.0 ng/mL on 02/15/2016.     PMH: Past Medical History:  Diagnosis Date  . BPH (benign prostatic hyperplasia)   . Cataracts, bilateral   . Chronic prostatitis   . Elevated PSA   . Goiter   . Herpes simplex   . History of colonic polyps   . Hyperlipidemia   . Nocturia   . Skin cancer, basal cell     Surgical History: Past Surgical History:  Procedure Laterality Date  . TONSILLECTOMY     child    Home Medications:  Allergies as of 02/20/2016      Reactions   Lipitor [atorvastatin]    Muscle aches      Medication List       Accurate as of 02/20/16  9:20 AM. Always use your most recent  med list.          acyclovir 200 MG capsule Commonly known as:  ZOVIRAX Take 200 mg by mouth 5 (five) times daily. Reported on 02/15/2015   acyclovir 400 MG tablet Commonly known as:  ZOVIRAX   aspirin 81 MG tablet Take 81 mg by mouth daily. Reported on 02/15/2015   CALCIUM POLYCARBOPHIL PO Take by mouth.   cephALEXin 500 MG capsule Commonly known as:  KEFLEX   chlorpheniramine-HYDROcodone 10-8 MG/5ML Suer Commonly known as:  TUSSIONEX TK 5 MLS PO Q 12 H PRF COUGH   doxycycline 100 MG capsule Commonly known as:  VIBRAMYCIN Take 100 mg by mouth.   fexofenadine-pseudoephedrine 180-240 MG 24 hr tablet Commonly known as:  ALLEGRA-D 24 Take by mouth.   finasteride 5 MG tablet Commonly known as:  PROSCAR Take 1 tablet (5 mg total) by mouth daily.   lansoprazole 30 MG capsule Commonly known as:  PREVACID Take by mouth.   meloxicam 15 MG tablet Commonly known as:  MOBIC   oseltamivir 75 MG capsule Commonly known as:  TAMIFLU   polyethylene glycol powder powder Commonly known as:  GLYCOLAX/MIRALAX As directed for colonic  prep.   PROBIOTIC ADVANCED Caps Take by mouth.   tamsulosin 0.4 MG Caps capsule Commonly known as:  FLOMAX Take 1 capsule (0.4 mg total) by mouth daily.       Allergies:  Allergies  Allergen Reactions  . Lipitor [Atorvastatin]     Muscle aches    Family History: Family History  Problem Relation Age of Onset  . Prostate cancer Father   . Breast cancer Mother   . Kidney disease Neg Hx   . Bladder Cancer Neg Hx     Social History:  reports that he has been smoking Cigarettes.  He has a 20.00 pack-year smoking history. He has never used smokeless tobacco. He reports that he drinks about 1.2 oz of alcohol per week . He reports that he does not use drugs.  ROS: UROLOGY Frequent Urination?: No Hard to postpone urination?: No Burning/pain with urination?: No Get up at night to urinate?: Yes Leakage of urine?: No Urine stream starts and stops?: No Trouble starting stream?: No Do you have to strain to urinate?: No Blood in urine?: No Urinary tract infection?: No Sexually transmitted disease?: No Injury to kidneys or bladder?: No Painful intercourse?: No Weak stream?: No Erection problems?: No Penile pain?: No Gastrointestinal Nausea?: No Vomiting?: No Indigestion/heartburn?: No Diarrhea?: No Constipation?: No Constitutional Fever: No Night sweats?: No Weight loss?: No Fatigue?: No Skin Skin rash/lesions?: No Itching?: No Eyes Blurred vision?: No Double vision?: No Ears/Nose/Throat Sore throat?: No Sinus problems?: No Hematologic/Lymphatic Swollen glands?: No Easy bruising?: No Cardiovascular Leg swelling?: No Chest pain?: No Respiratory Cough?: No Shortness of breath?: No Endocrine Excessive thirst?: No Musculoskeletal Back pain?: No Joint pain?: No Neurological Headaches?: No Dizziness?: No Psychologic Depression?: No Anxiety?: No   Physical Exam: BP 130/70   Pulse 66   Ht 6' (1.829 m)   Wt 184 lb 4.8 oz (83.6 kg)   BMI 25.00 kg/m    GU: Patient with circumcised phallus.   Urethral meatus is patent.  No penile discharge. No penile lesions or rashes. Scrotum without lesions, cysts, rashes and/or edema.  Testicles are located scrotally bilaterally. No masses are appreciated in the testicles. Left and right epididymis are normal. Rectal: Patient with  normal sphincter tone. Perineum without scarring or rashes. No rectal masses are appreciated. Prostate is approximately 60 grams,  no nodules are appreciated. Seminal vesicles are normal.   Laboratory Data: PSA History:  5.77 ng/mL on 04/06/2013  3.00 ng/mL on 08/09/2013    1.9 ng/mL on 08/17/2014     1.7 ng/mL on 02/13/2015  1.9 ng/mL on 08/17/2015  2.00 ng/mL on 02/15/2016   Assessment & Plan:   1. BPH with LUTS  - IPSS score is 12/2, it is improving  - Continue conservative management, avoiding bladder irritants and timed voiding's  - Continue tamsulosin 0.4 mg daily and finasteride 5 mg daily; refills given  - Cannot tolerate medication or medication failure, schedule cystoscopy  - RTC in 6 months for IPSS, PSA, PVR and exam   2. History of elevated PSA  - current PSA was 2.0 in 02/15/2016  - RTC in 6 months for PSA and exam  3. Family history of prostate cancer:   Father with prostate cancer.  See above.     Return in about 6 months (around 08/20/2016) for IPSS, PSA and exam.  Zara Council, [facility]  [facility] [address] [place] [employer] [employer] [place], [place] [address] [phone]

## 2016-02-20 ENCOUNTER — Ambulatory Visit: Payer: Medicare Other | Admitting: Urology

## 2016-02-20 ENCOUNTER — Encounter: Payer: Self-pay | Admitting: Urology

## 2016-02-20 ENCOUNTER — Other Ambulatory Visit: Payer: Self-pay

## 2016-02-20 VITALS — BP 130/70 | HR 66 | Ht 72.0 in | Wt 184.3 lb

## 2016-02-20 DIAGNOSIS — N138 Other obstructive and reflux uropathy: Secondary | ICD-10-CM

## 2016-02-20 DIAGNOSIS — N401 Enlarged prostate with lower urinary tract symptoms: Secondary | ICD-10-CM

## 2016-02-20 DIAGNOSIS — Z8042 Family history of malignant neoplasm of prostate: Secondary | ICD-10-CM

## 2016-02-20 DIAGNOSIS — R972 Elevated prostate specific antigen [PSA]: Secondary | ICD-10-CM

## 2016-02-20 DIAGNOSIS — Z87898 Personal history of other specified conditions: Secondary | ICD-10-CM

## 2016-02-20 LAB — BLADDER SCAN AMB NON-IMAGING: Scan Result: 27

## 2016-02-20 MED ORDER — TAMSULOSIN HCL 0.4 MG PO CAPS: 0.4000 mg | ORAL_CAPSULE | Freq: Every day | ORAL | 3 refills | Status: DC

## 2016-02-20 MED ORDER — FINASTERIDE 5 MG PO TABS: 5.0000 mg | ORAL_TABLET | Freq: Every day | ORAL | 3 refills | Status: DC

## 2016-04-17 ENCOUNTER — Encounter: Payer: Self-pay | Admitting: *Deleted

## 2016-04-18 ENCOUNTER — Encounter: Payer: Self-pay | Admitting: *Deleted

## 2016-04-18 ENCOUNTER — Ambulatory Visit: Payer: Medicare Other | Admitting: Anesthesiology

## 2016-04-18 ENCOUNTER — Encounter
Admission: RE | Disposition: A | Discharge status: Home / Self Care | Payer: Self-pay | Source: Ambulatory Visit | Attending: Gastroenterology

## 2016-04-18 ENCOUNTER — Ambulatory Visit
Admission: RE | Admit: 2016-04-18 | Discharge: 2016-04-18 | Disposition: A | Discharge status: Home / Self Care | Payer: Medicare Other | Source: Ambulatory Visit | Attending: Gastroenterology | Admitting: Gastroenterology

## 2016-04-18 DIAGNOSIS — Z791 Long term (current) use of non-steroidal anti-inflammatories (NSAID): Secondary | ICD-10-CM | POA: Diagnosis not present

## 2016-04-18 DIAGNOSIS — Z79899 Other long term (current) drug therapy: Secondary | ICD-10-CM | POA: Insufficient documentation

## 2016-04-18 DIAGNOSIS — K635 Polyp of colon: Secondary | ICD-10-CM | POA: Insufficient documentation

## 2016-04-18 DIAGNOSIS — I10 Essential (primary) hypertension: Secondary | ICD-10-CM | POA: Insufficient documentation

## 2016-04-18 DIAGNOSIS — Z1211 Encounter for screening for malignant neoplasm of colon: Secondary | ICD-10-CM | POA: Insufficient documentation

## 2016-04-18 DIAGNOSIS — K621 Rectal polyp: Secondary | ICD-10-CM | POA: Diagnosis not present

## 2016-04-18 DIAGNOSIS — K5730 Diverticulosis of large intestine without perforation or abscess without bleeding: Secondary | ICD-10-CM | POA: Insufficient documentation

## 2016-04-18 DIAGNOSIS — Z792 Long term (current) use of antibiotics: Secondary | ICD-10-CM | POA: Diagnosis not present

## 2016-04-18 DIAGNOSIS — J449 Chronic obstructive pulmonary disease, unspecified: Secondary | ICD-10-CM | POA: Diagnosis not present

## 2016-04-18 DIAGNOSIS — F17200 Nicotine dependence, unspecified, uncomplicated: Secondary | ICD-10-CM | POA: Diagnosis not present

## 2016-04-18 DIAGNOSIS — Z7982 Long term (current) use of aspirin: Secondary | ICD-10-CM | POA: Insufficient documentation

## 2016-04-18 DIAGNOSIS — Z86010 Personal history of colonic polyps: Secondary | ICD-10-CM | POA: Diagnosis not present

## 2016-04-18 HISTORY — PX: COLONOSCOPY WITH PROPOFOL: SHX5780

## 2016-04-18 HISTORY — DX: Essential (primary) hypertension: I10

## 2016-04-18 HISTORY — DX: Nontoxic goiter, unspecified: E04.9

## 2016-04-18 HISTORY — DX: Labyrinthitis, unspecified ear: H83.09

## 2016-04-18 SURGERY — COLONOSCOPY WITH PROPOFOL
Anesthesia: General

## 2016-04-18 MED ORDER — PROPOFOL 10 MG/ML IV BOLUS
INTRAVENOUS | Status: DC | PRN
  Administered 2016-04-18: 70 mg via INTRAVENOUS

## 2016-04-18 MED ORDER — PROPOFOL 500 MG/50ML IV EMUL
INTRAVENOUS | Status: DC | PRN
  Administered 2016-04-18: 175 ug/kg/min via INTRAVENOUS

## 2016-04-18 MED ORDER — SODIUM CHLORIDE 0.9 % IV SOLN
INTRAVENOUS | Status: DC
  Administered 2016-04-18 (×2): via INTRAVENOUS

## 2016-04-18 MED ORDER — PROPOFOL 500 MG/50ML IV EMUL
INTRAVENOUS | Status: AC
  Filled 2016-04-18: qty 50

## 2016-04-18 NOTE — Op Note (Signed)
[facility] Gastroenterology Patient Name: Ryan Mejia Procedure Date: 04/18/2016 2:32 PM MRN: [id number] Account #: [id number] Date of Birth: 1946/02/09 Admit Type: Outpatient Age: 71 Room: [facility] ENDO ROOM 3 Gender: Male Note Status: Finalized Procedure:            Colonoscopy Indications:          Personal history of colonic polyps Providers:            Lollie Sails, MD Referring MD:         Leonie Douglas. Doy Hutching, MD (Referring MD) Medicines:            Monitored Anesthesia Care Complications:        No immediate complications. Procedure:            Pre-Anesthesia Assessment:                       - ASA Grade Assessment: II - A patient with mild                        systemic disease.                       After obtaining informed consent, the colonoscope was                        passed under direct vision. Throughout the procedure,                        the patient's blood pressure, pulse, and oxygen                        saturations were monitored continuously. The                        Colonoscope was introduced through the anus and                        advanced to the the cecum, identified by appendiceal                        orifice and ileocecal valve. The colonoscopy was                        performed with moderate difficulty due to poor bowel                        prep and significant looping and difficulty maintaining                        insuffaltion. The patient tolerated the procedure well.                        The quality of the bowel preparation was fair. Findings:      Multiple medium-mouthed diverticula were found in the sigmoid colon and       descending colon.      A 2 mm polyp was found in the rectum. The polyp was sessile. The polyp       was removed with a cold biopsy forceps. Resection and retrieval were       complete.      Four sessile  polyps were found in the proximal sigmoid colon. The polyps       were 1 to 3 mm in  size. Three of these polyps were removed with a cold       biopsy forceps, one with a cold snare. Resection and retrieval were       complete.      A 4 mm polyp was found in the splenic flexure. The polyp was sessile.       The polyp was removed with a cold snare. Resection and retrieval were       complete.      A 2 mm polyp was found in the cecum. The polyp was sessile. The polyp       was removed with a cold biopsy forceps. Resection and retrieval were       complete.      The retroflexed view of the distal rectum and anal verge was normal and       showed no anal or rectal abnormalities.      The exam was otherwise without abnormality.      The digital rectal exam was normal. Impression:           - Preparation of the colon was fair.                       - Diverticulosis in the sigmoid colon and in the                        descending colon.                       - One 2 mm polyp in the rectum, removed with a cold                        biopsy forceps. Resected and retrieved.                       - Four 1 to 3 mm polyps in the proximal sigmoid colon,                        removed with a cold biopsy forceps. Resected and                        retrieved.                       - One 4 mm polyp at the splenic flexure, removed with a                        cold snare. Resected and retrieved.                       - One 2 mm polyp in the cecum, removed with a cold                        biopsy forceps. Resected and retrieved.                       - The distal rectum and anal verge are normal on  retroflexion view.                       - The examination was otherwise normal. Recommendation:       - Await pathology results.                       - Telephone GI clinic for pathology results in 1 week. Procedure Code(s):    --- Professional ---                       [phone], Colonoscopy, flexible; with removal of tumor(s),                        polyp(s), or other  lesion(s) by snare technique                       [id number], 37, Colonoscopy, flexible; with biopsy, single                        or multiple CPT copyright 2016 American Medical Association. All rights reserved. The codes documented in this report are preliminary and upon coder review may  be revised to meet current compliance requirements. Lollie Sails, MD 04/18/2016 3:46:31 PM This report has been signed electronically. Number of Addenda: 0 Note Initiated On: 04/18/2016 2:32 PM Scope Withdrawal Time: 0 hours 14 minutes 28 seconds  Total Procedure Duration: 0 hours 49 minutes 55 seconds       [facility]

## 2016-04-18 NOTE — Anesthesia Procedure Notes (Signed)
Date/Time: 04/18/2016 2:48 PM Performed by: Nelda Marseille Pre-anesthesia Checklist: Patient identified, Emergency Drugs available, Suction available, Patient being monitored and Timeout performed Oxygen Delivery Method: Nasal cannula

## 2016-04-18 NOTE — Transfer of Care (Signed)
Immediate Anesthesia Transfer of Care Note  Patient: Ryan Mejia  Procedure(s) Performed: Procedure(s): COLONOSCOPY WITH PROPOFOL (N/A)  Patient Location: PACU  Anesthesia Type:General  Level of Consciousness: sedated  Airway & Oxygen Therapy: Patient Spontanous Breathing and Patient connected to nasal cannula oxygen  Post-op Assessment: Report given to RN and Post -op Vital signs reviewed and stable  Post vital signs: Reviewed and stable  Last Vitals:  Vitals:   04/18/16 1340  BP: 110/72  Pulse: 60  Resp: 20  Temp: 36.2 C    Last Pain:  Vitals:   04/18/16 1340  TempSrc: Tympanic         Complications: No apparent anesthesia complications

## 2016-04-18 NOTE — Anesthesia Preprocedure Evaluation (Addendum)
Anesthesia Evaluation  Patient identified by MRN, date of birth, ID band Patient awake    Reviewed: Allergy & Precautions, NPO status , Patient's Chart, lab work & pertinent test results  Airway Mallampati: II       Dental  (+) Teeth Intact   Pulmonary COPD, Current Smoker,    breath sounds clear to auscultation       Cardiovascular Exercise Tolerance: Good hypertension,  Rhythm:Regular     Neuro/Psych negative neurological ROS     GI/Hepatic negative GI ROS, Neg liver ROS,   Endo/Other  negative endocrine ROS  Renal/GU negative Renal ROS     Musculoskeletal negative musculoskeletal ROS (+)   Abdominal Normal abdominal exam  (+)   Peds  Hematology negative hematology ROS (+)   Anesthesia Other Findings   Reproductive/Obstetrics                            Anesthesia Physical Anesthesia Plan  ASA: II  Anesthesia Plan: General   Post-op Pain Management:    Induction: Intravenous  Airway Management Planned: Natural Airway and Nasal Cannula  Additional Equipment:   Intra-op Plan:   Post-operative Plan:   Informed Consent: I have reviewed the patients History and Physical, chart, labs and discussed the procedure including the risks, benefits and alternatives for the proposed anesthesia with the patient or authorized representative who has indicated his/her understanding and acceptance.     Plan Discussed with: CRNA  Anesthesia Plan Comments:         Anesthesia Quick Evaluation

## 2016-04-18 NOTE — H&P (Signed)
Outpatient short stay form Pre-procedure 04/18/2016 1:29 PM Ryan Sails MD  Primary Physician: Dr. Fulton Reek  Reason for visit:  Colonoscopy  History of present illness:  Patient is a 71 year old male presenting today as above.. Has a personal history of adenomatous colon polyps. He tolerated his prep well. He takes no aspirin or blood thinning agents with the exception of an 81 mg aspirin.    Current Facility-Administered Medications:  .  0.9 %  sodium chloride infusion, , Intravenous, Continuous, Ryan Sails, MD  Prescriptions Prior to Admission  Medication Sig Dispense Refill Last Dose  . acyclovir (ZOVIRAX) 200 MG capsule Take 200 mg by mouth 5 (five) times daily. Reported on 02/15/2015   04/17/2016 at Unknown time  . acyclovir (ZOVIRAX) 400 MG tablet    04/17/2016 at Unknown time  . finasteride (PROSCAR) 5 MG tablet Take 1 tablet (5 mg total) by mouth daily. 90 tablet 3 04/17/2016 at Unknown time  . Probiotic Product (PROBIOTIC ADVANCED) CAPS Take by mouth.   Past Week at Unknown time  . tamsulosin (FLOMAX) 0.4 MG CAPS capsule Take 1 capsule (0.4 mg total) by mouth daily. 90 capsule 3 04/17/2016 at Unknown time  . aspirin 81 MG tablet Take 81 mg by mouth daily. Reported on 02/15/2015   Completed Course at Unknown time  . CALCIUM POLYCARBOPHIL PO Take by mouth.   Not Taking  . cephALEXin (KEFLEX) 500 MG capsule    Completed Course at Unknown time  . chlorpheniramine-HYDROcodone (TUSSIONEX) 10-8 MG/5ML SUER TK 5 MLS PO Q 12 H PRF COUGH   Completed Course at Unknown time  . doxycycline (VIBRAMYCIN) 100 MG capsule Take 100 mg by mouth.   Not Taking  . fexofenadine-pseudoephedrine (ALLEGRA-D 24) 180-240 MG 24 hr tablet Take by mouth.   Completed Course at Unknown time  . lansoprazole (PREVACID) 30 MG capsule Take by mouth.   Completed Course at Unknown time  . meloxicam (MOBIC) 15 MG tablet    Completed Course at Unknown time  . oseltamivir (TAMIFLU) 75 MG capsule     Completed Course at Unknown time  . polyethylene glycol powder (GLYCOLAX/MIRALAX) powder As directed for colonic prep.   Completed Course at Unknown time     Allergies  Allergen Reactions  . Lipitor [Atorvastatin]     Muscle aches     Past Medical History:  Diagnosis Date  . BPH (benign prostatic hyperplasia)   . Cataracts, bilateral   . Chronic prostatitis   . Elevated PSA   . Goiter   . Goiter, nodular   . Herpes simplex   . History of colonic polyps   . Hyperlipidemia   . Hypertension   . Labyrinthitis   . Nocturia   . Skin cancer, basal cell     Review of systems:      Physical Exam    Heart and lungs: Regular rate and rhythm without rub or gallop, lungs are bilaterally clear.    HEENT: Normocephalic atraumatic eyes are anicteric    Other:   Pertinant exam for procedure: Soft nontender nondistended bowel sounds positive normoactive    Planned proceedures: Colonoscopy and indicated procedures. I have discussed the risks benefits and complications of procedures to include not limited to bleeding, infection, perforation and the risk of sedation and the patient wishes to proceed.    Ryan Sails, MD Gastroenterology 04/18/2016  1:29 PM

## 2016-04-18 NOTE — Anesthesia Post-op Follow-up Note (Cosign Needed)
Anesthesia QCDR form completed.        

## 2016-04-19 ENCOUNTER — Encounter: Payer: Self-pay | Admitting: Gastroenterology

## 2016-04-19 NOTE — Anesthesia Postprocedure Evaluation (Signed)
Anesthesia Post Note  Patient: Ryan Mejia  Procedure(s) Performed: Procedure(s) (LRB): COLONOSCOPY WITH PROPOFOL (N/A)  Patient location during evaluation: PACU Anesthesia Type: General Level of consciousness: awake Pain management: pain level controlled Vital Signs Assessment: post-procedure vital signs reviewed and stable Respiratory status: spontaneous breathing Cardiovascular status: stable Anesthetic complications: no     Last Vitals:  Vitals:   04/18/16 1548 04/18/16 1557  BP: 94/72 109/65  Pulse: (!) 59 (!) 55  Resp: 15 14  Temp:      Last Pain:  Vitals:   04/18/16 1537  TempSrc: Tympanic                 VAN STAVEREN,Laymond Postle

## 2016-04-22 LAB — SURGICAL PATHOLOGY

## 2016-04-25 DIAGNOSIS — Z85828 Personal history of other malignant neoplasm of skin: Secondary | ICD-10-CM | POA: Insufficient documentation

## 2016-08-01 ENCOUNTER — Other Ambulatory Visit: Payer: Self-pay

## 2016-08-01 DIAGNOSIS — N401 Enlarged prostate with lower urinary tract symptoms: Secondary | ICD-10-CM

## 2016-08-20 ENCOUNTER — Other Ambulatory Visit: Payer: Medicare Other

## 2016-08-20 DIAGNOSIS — N401 Enlarged prostate with lower urinary tract symptoms: Secondary | ICD-10-CM

## 2016-08-21 LAB — PSA: Prostate Specific Ag, Serum: 1.6 ng/mL (ref 0.0–4.0)

## 2016-08-21 NOTE — Progress Notes (Signed)
9:28 AM   Ryan Mejia 1945-04-09 [id number]  Referring provider: Idelle Crouch, MD [facility] [place], [place] [address]  Chief Complaint  Patient presents with  . Benign Prostatic Hypertrophy    6 month follow up   . Elevated PSA    HPI: Patient is a 71 year old Caucasian male with a history of elevated PSA and BPH with LUTS who presents today for a 6 month follow up.  BPH WITH LUTS His IPSS score today is 11, which is moderate lower urinary tract symptomatology. He is pleased with his quality life due to his urinary symptoms.  His previous I PSS score was 12/2 .  His previous PVR was 27 mL.  His major complaints today are nocturia x 2-3.  He has had these symptoms for several years.  He did not notice an improvement with the branded finasteride and tamsulosin.  He denies any dysuria, hematuria or suprapubic pain.   He also denies any recent fevers, chills, nausea or vomiting.   He has a family history of PCa, with his father being diagnosed with prostate cancer.        IPSS    Row Name 08/22/16 0800         International Prostate Symptom Score   How often have you had the sensation of not emptying your bladder? Less than half the time     How often have you had to urinate less than every two hours? About half the time     How often have you found you stopped and started again several times when you urinated? Less than 1 in 5 times     How often have you found it difficult to postpone urination? Less than 1 in 5 times     How often have you had a weak urinary stream? Less than 1 in 5 times     How often have you had to strain to start urination? Not at All     How many times did you typically get up at night to urinate? 3 Times     Total IPSS Score 11       Quality of Life due to urinary symptoms   If you were to spend the rest of your life with your urinary condition just the way it is now how would you feel about that? Mostly Satisfied         Score:  1-7 Mild 8-19 Moderate 20-35 Severe   History of elevated  PSA Patient was referred to [place] in 2015  for PSA of 5.77 ng/mL by his primary care physician.  He was initiated on finasteride at that time.  His most recent PSA was 1.6 ng/mL on 08/20/2016.     PMH: Past Medical History:  Diagnosis Date  . BPH (benign prostatic hyperplasia)   . Cataracts, bilateral   . Chronic prostatitis   . Elevated PSA   . Goiter   . Goiter, nodular   . Herpes simplex   . History of colonic polyps   . Hyperlipidemia   . Hypertension   . Labyrinthitis   . Nocturia   . Skin cancer, basal cell     Surgical History: Past Surgical History:  Procedure Laterality Date  . COLONOSCOPY WITH PROPOFOL N/A 04/18/2016   Procedure: COLONOSCOPY WITH PROPOFOL;  Surgeon: Lollie Sails, MD;  Location: [facility] ENDOSCOPY;  Service: Endoscopy;  Laterality: N/A;  . SKIN CANCER DESTRUCTION    .  TONSILLECTOMY     child    Home Medications:  Allergies as of 08/22/2016      Reactions   Lipitor [atorvastatin]    Muscle aches      Medication List       Accurate as of 08/22/16  9:28 AM. Always use your most recent med list.          acyclovir 200 MG capsule Commonly known as:  ZOVIRAX Take 200 mg by mouth 5 (five) times daily. Reported on 02/15/2015   acyclovir 400 MG tablet Commonly known as:  ZOVIRAX   aspirin 81 MG tablet Take 81 mg by mouth daily. Reported on 02/15/2015   CALCIUM POLYCARBOPHIL PO Take by mouth.   cephALEXin 500 MG capsule Commonly known as:  KEFLEX   chlorpheniramine-HYDROcodone 10-8 MG/5ML Suer Commonly known as:  TUSSIONEX TK 5 MLS PO Q 12 H PRF COUGH   Desmopressin Acetate 0.83 MCG/0.1ML Emul Commonly known as:  [employer] 1 Act into the nose at bedtime.   doxycycline 100 MG capsule Commonly known as:  VIBRAMYCIN Take 100 mg by mouth.   fexofenadine-pseudoephedrine 180-240 MG 24 hr tablet Commonly known as:  ALLEGRA-D 24 Take by mouth.     finasteride 5 MG tablet Commonly known as:  PROSCAR Take 1 tablet (5 mg total) by mouth daily.   ketoconazole 2 % shampoo Commonly known as:  NIZORAL Shampoo head, leave on for 5 minutes, then rinse off. Use daily until controlled, then use 3 times a week.   lansoprazole 30 MG capsule Commonly known as:  PREVACID Take by mouth.   meloxicam 15 MG tablet Commonly known as:  MOBIC   niacinamide 500 MG tablet Take 500 mg by mouth.   oseltamivir 75 MG capsule Commonly known as:  TAMIFLU   polyethylene glycol powder powder Commonly known as:  GLYCOLAX/MIRALAX As directed for colonic prep.   PROBIOTIC ADVANCED Caps Take by mouth.   tamsulosin 0.4 MG Caps capsule Commonly known as:  FLOMAX Take 1 capsule (0.4 mg total) by mouth daily.       Allergies:  Allergies  Allergen Reactions  . Lipitor [Atorvastatin]     Muscle aches    Family History: Family History  Problem Relation Age of Onset  . Prostate cancer Father   . Breast cancer Mother   . Kidney disease Neg Hx   . Bladder Cancer Neg Hx   . Kidney cancer Neg Hx     Social History:  reports that he has been smoking Cigarettes and E-cigarettes.  He has a 20.00 pack-year smoking history. He has never used smokeless tobacco. He reports that he drinks about 1.2 oz of alcohol per week . He reports that he does not use drugs.  ROS: UROLOGY Frequent Urination?: No Hard to postpone urination?: No Burning/pain with urination?: No Get up at night to urinate?: Yes Leakage of urine?: No Urine stream starts and stops?: No Trouble starting stream?: No Do you have to strain to urinate?: No Blood in urine?: No Urinary tract infection?: No Sexually transmitted disease?: No Injury to kidneys or bladder?: No Painful intercourse?: No Weak stream?: No Erection problems?: No Penile pain?: No Gastrointestinal Nausea?: No Vomiting?: No Indigestion/heartburn?: No Diarrhea?: No Constipation?: No Constitutional Fever:  No Night sweats?: No Weight loss?: No Fatigue?: No Skin Skin rash/lesions?: No Itching?: No Eyes Blurred vision?: No Double vision?: No Ears/Nose/Throat Sore throat?: No Sinus problems?: No Hematologic/Lymphatic Swollen glands?: No Easy bruising?: No Cardiovascular Leg swelling?: No Chest pain?: No  Respiratory Cough?: No Shortness of breath?: No Endocrine Excessive thirst?: No Musculoskeletal Back pain?: No Joint pain?: No Neurological Headaches?: No Dizziness?: No Psychologic Depression?: No Anxiety?: No   Physical Exam: BP 127/68   Pulse 69   Ht 5\' 11"  (1.803 m)   Wt 174 lb 11.2 oz (79.2 kg)   BMI 24.37 kg/m   Constitutional: Well nourished. Alert and oriented, No acute distress. HEENT: [place] AT, moist mucus membranes. Trachea midline, no masses. Cardiovascular: No clubbing, cyanosis, or edema. Respiratory: Normal respiratory effort, no increased work of breathing. GI: Abdomen is soft, non tender, non distended, no abdominal masses. Liver and spleen not palpable.  No hernias appreciated.  Stool sample for occult testing is not indicated.   GU: No CVA tenderness.  No bladder fullness or masses.  Patient with circumcised phallus. Urethral meatus is patent.  No penile discharge. No penile lesions or rashes. Scrotum without lesions, cysts, rashes and/or edema.  Testicles are located scrotally bilaterally. No masses are appreciated in the testicles. Left and right epididymis are normal. Rectal: Patient with  normal sphincter tone. Anus and perineum without scarring or rashes. No rectal masses are appreciated. Prostate is approximately 55 grams, no nodules are appreciated. Seminal vesicles are normal. Skin: No rashes, bruises or suspicious lesions. Lymph: No cervical or inguinal adenopathy. Neurologic: Grossly intact, no focal deficits, moving all 4 extremities. Psychiatric: Normal mood and affect.   Laboratory Data: PSA History:  5.77 ng/mL on 04/06/2013  3.00  ng/mL on 08/09/2013    1.9 ng/mL on 08/17/2014     1.7 ng/mL on 02/13/2015  1.9 ng/mL on 08/17/2015  2.00 ng/mL on 02/15/2016  1.6 ng/mL on 08/20/2016   Assessment & Plan:   1. BPH with LUTS  - IPSS score is 11/2, it is improving  - Continue conservative management, avoiding bladder irritants and timed voiding's  - Continue tamsulosin 0.4 mg daily and finasteride 5 mg daily; refills given  - RTC in 6 months for IPSS, PSA and exam   2. History of elevated PSA  - current PSA was 1.6  - RTC in 6 months for PSA and exam  3. Family history of prostate cancer:   Father with prostate cancer.  See above.    4. Nocturia  - patient has had a sleep study and he does not have sleep apnea  - discussed the new medication Noctiva with the patient and he would like to try the medication  - Noctiva, 1 spray into one nostril qhs  - check a sodium level in 1 week  - RTC in one month for a sodium level and  I PSS score   Return in about 1 week (around 08/29/2016) for check sodium level.  Zara Council, [facility] [address], [place], [place] [address] [phone]

## 2016-08-22 ENCOUNTER — Ambulatory Visit: Payer: Medicare Other | Admitting: Urology

## 2016-08-22 ENCOUNTER — Encounter: Payer: Self-pay | Admitting: Urology

## 2016-08-22 VITALS — BP 127/68 | HR 69 | Ht 71.0 in | Wt 174.7 lb

## 2016-08-22 DIAGNOSIS — N401 Enlarged prostate with lower urinary tract symptoms: Secondary | ICD-10-CM

## 2016-08-22 DIAGNOSIS — N138 Other obstructive and reflux uropathy: Secondary | ICD-10-CM

## 2016-08-22 DIAGNOSIS — Z87898 Personal history of other specified conditions: Secondary | ICD-10-CM

## 2016-08-22 DIAGNOSIS — Z8042 Family history of malignant neoplasm of prostate: Secondary | ICD-10-CM

## 2016-08-22 DIAGNOSIS — R351 Nocturia: Secondary | ICD-10-CM

## 2016-08-22 MED ORDER — TAMSULOSIN HCL 0.4 MG PO CAPS: 0.4000 mg | ORAL_CAPSULE | Freq: Every day | ORAL | 3 refills | Status: DC

## 2016-08-22 MED ORDER — FINASTERIDE 5 MG PO TABS: 5.0000 mg | ORAL_TABLET | Freq: Every day | ORAL | 3 refills | Status: DC

## 2016-08-22 MED ORDER — DESMOPRESSIN ACETATE 0.83 MCG/0.1ML NA EMUL: 1.0000 | Freq: Every day | NASAL | 3 refills | Status: DC

## 2016-08-27 ENCOUNTER — Telehealth: Payer: Self-pay

## 2016-08-27 NOTE — Telephone Encounter (Signed)
Faxed Noctivia PA form to [employer].

## 2016-08-29 ENCOUNTER — Other Ambulatory Visit: Payer: Medicare Other

## 2016-09-02 NOTE — Telephone Encounter (Signed)
[employer] [phone]) called and left a message on the voice mail. They have some questions and/or need more information regarding the prior authorization for Noctiva. Ref #[id number]

## 2016-09-03 NOTE — Telephone Encounter (Signed)
Faxed confirmation of diagnosis to appeals dept as requested by [employer]. Fax#[phone]

## 2016-11-03 ENCOUNTER — Other Ambulatory Visit: Payer: Self-pay | Admitting: Urology

## 2016-11-03 DIAGNOSIS — N401 Enlarged prostate with lower urinary tract symptoms: Secondary | ICD-10-CM

## 2016-11-05 ENCOUNTER — Telehealth: Payer: Self-pay | Admitting: Urology

## 2016-11-05 NOTE — Telephone Encounter (Signed)
Would you ask Ryan Mejia if he ever received the Noctiva?

## 2016-11-07 NOTE — Telephone Encounter (Signed)
Pt called back to let you know he decided not to take the Noctiva.  He was afraid of what it might do.

## 2016-11-07 NOTE — Telephone Encounter (Signed)
LMOM

## 2016-11-08 ENCOUNTER — Other Ambulatory Visit: Payer: Self-pay

## 2016-11-08 DIAGNOSIS — N401 Enlarged prostate with lower urinary tract symptoms: Secondary | ICD-10-CM

## 2016-11-08 MED ORDER — TAMSULOSIN HCL 0.4 MG PO CAPS: 0.4000 mg | ORAL_CAPSULE | Freq: Every day | ORAL | 1 refills | Status: DC

## 2017-01-30 DIAGNOSIS — M5137 Other intervertebral disc degeneration, lumbosacral region: Secondary | ICD-10-CM | POA: Insufficient documentation

## 2017-02-18 ENCOUNTER — Other Ambulatory Visit: Payer: Medicare Other

## 2017-02-19 NOTE — Progress Notes (Signed)
10:28 AM   Ryan Mejia 1945-05-18 [id number]  Referring provider: Idelle Crouch, MD [facility] [place], [place] [address]  Chief Complaint  Patient presents with  . Benign Prostatic Hypertrophy    HPI: Patient is a 71 year old Caucasian male with a history of elevated PSA, nocturia and BPH with LUTS who presents today for a 6 month follow up.  BPH WITH LUTS His IPSS score today is 10, which is moderate lower urinary tract symptomatology. He is mostly satisfied with his quality life due to his urinary symptoms.  His previous I PSS score was 11/1.  His major complaints today are nocturia x 2-3.  He did not try the Noctiva due to her fear of side effects.  He has had these symptoms for several years.  He did not notice an improvement with the branded finasteride and tamsulosin.  He denies any dysuria, hematuria or suprapubic pain.   He also denies any recent fevers, chills, nausea or vomiting.   He has a family history of PCa, with his father being diagnosed with prostate cancer.    IPSS    Row Name 02/20/17 0900         International Prostate Symptom Score   How often have you had the sensation of not emptying your bladder?  Less than 1 in 5     How often have you had to urinate less than every two hours?  About half the time     How often have you found you stopped and started again several times when you urinated?  Not at All     How often have you found it difficult to postpone urination?  Less than 1 in 5 times     How often have you had a weak urinary stream?  About half the time     How often have you had to strain to start urination?  Not at All     How many times did you typically get up at night to urinate?  2 Times     Total IPSS Score  10       Quality of Life due to urinary symptoms   If you were to spend the rest of your life with your urinary condition just the way it is now how would you feel about that?  Mostly Satisfied         Score:  1-7 Mild 8-19 Moderate 20-35 Severe   History of elevated  PSA Patient was referred to [place] in 2015  for PSA of 5.77 ng/mL by his primary care physician.  He was initiated on finasteride at that time.  His most recent PSA was 1.79 ng/mL on 02/04/2017.     PMH: Past Medical History:  Diagnosis Date  . BPH (benign prostatic hyperplasia)   . Cataracts, bilateral   . Chronic prostatitis   . Elevated PSA   . Goiter   . Goiter, nodular   . Herpes simplex   . History of colonic polyps   . Hyperlipidemia   . Hypertension   . Labyrinthitis   . Nocturia   . Skin cancer, basal cell     Surgical History: Past Surgical History:  Procedure Laterality Date  . COLONOSCOPY WITH PROPOFOL N/A 04/18/2016   Procedure: COLONOSCOPY WITH PROPOFOL;  Surgeon: Lollie Sails, MD;  Location: [facility] ENDOSCOPY;  Service: Endoscopy;  Laterality: N/A;  . SKIN CANCER DESTRUCTION    . TONSILLECTOMY  child    Home Medications:  Allergies as of 02/20/2017      Reactions   Lipitor [atorvastatin]    Muscle aches      Medication List        Accurate as of 02/20/17 10:28 AM. Always use your most recent med list.          acyclovir 200 MG capsule Commonly known as:  ZOVIRAX Take 200 mg by mouth 5 (five) times daily. Reported on 02/15/2015   acyclovir 400 MG tablet Commonly known as:  ZOVIRAX   aspirin 81 MG tablet Take 81 mg by mouth daily. Reported on 02/15/2015   fexofenadine-pseudoephedrine 180-240 MG 24 hr tablet Commonly known as:  ALLEGRA-D 24 Take by mouth.   finasteride 5 MG tablet Commonly known as:  PROSCAR TAKE 1 TABLET BY MOUTH  DAILY   ketoconazole 2 % shampoo Commonly known as:  NIZORAL Shampoo head, leave on for 5 minutes, then rinse off. Use daily until controlled, then use 3 times a week.   lansoprazole 30 MG capsule Commonly known as:  PREVACID Take by mouth.   polycarbophil 625 MG tablet Commonly known as:  FIBERCON Take 625 mg by mouth  daily.   PROBIOTIC ADVANCED Caps Take by mouth.   tamsulosin 0.4 MG Caps capsule Commonly known as:  FLOMAX Take 1 capsule (0.4 mg total) by mouth daily.       Allergies:  Allergies  Allergen Reactions  . Lipitor [Atorvastatin]     Muscle aches    Family History: Family History  Problem Relation Age of Onset  . Prostate cancer Father   . Breast cancer Mother   . Kidney disease Neg Hx   . Bladder Cancer Neg Hx   . Kidney cancer Neg Hx     Social History:  reports that he has been smoking cigarettes and e-cigarettes.  He has a 20.00 pack-year smoking history. he has never used smokeless tobacco. He reports that he drinks about 1.2 oz of alcohol per week. He reports that he does not use drugs.  ROS: UROLOGY Frequent Urination?: No Hard to postpone urination?: No Burning/pain with urination?: No Get up at night to urinate?: Yes Leakage of urine?: No Urine stream starts and stops?: No Trouble starting stream?: No Do you have to strain to urinate?: No Blood in urine?: No Urinary tract infection?: No Sexually transmitted disease?: No Injury to kidneys or bladder?: No Painful intercourse?: No Weak stream?: No Erection problems?: No Penile pain?: No Gastrointestinal Nausea?: No Vomiting?: No Indigestion/heartburn?: No Diarrhea?: No Constipation?: No Constitutional Fever: No Night sweats?: No Weight loss?: No Fatigue?: No Skin Skin rash/lesions?: No Itching?: No Eyes Blurred vision?: No Double vision?: No Ears/Nose/Throat Sore throat?: No Sinus problems?: No Hematologic/Lymphatic Swollen glands?: No Easy bruising?: No Cardiovascular Leg swelling?: No Chest pain?: No Respiratory Cough?: No Shortness of breath?: No Endocrine Excessive thirst?: No Musculoskeletal Back pain?: No Joint pain?: No Neurological Headaches?: No Dizziness?: No Psychologic Depression?: No Anxiety?: No   Physical Exam: BP 118/73   Pulse 67   Ht 5\' 11"  (1.803 m)    Wt 175 lb 1.6 oz (79.4 kg)   BMI 24.42 kg/m   Constitutional: Well nourished. Alert and oriented, No acute distress. HEENT: [place] AT, moist mucus membranes. Trachea midline, no masses. Cardiovascular: No clubbing, cyanosis, or edema. Respiratory: Normal respiratory effort, no increased work of breathing. GI: Abdomen is soft, non tender, non distended, no abdominal masses. Liver and spleen not palpable.  No hernias appreciated.  Stool sample for occult testing is not indicated.   GU: No CVA tenderness.  No bladder fullness or masses.  Patient with circumcised phallus.   Urethral meatus is patent.  No penile discharge. No penile lesions or rashes. Scrotum without lesions, cysts, rashes and/or edema.  Testicles are located scrotally bilaterally. No masses are appreciated in the testicles. Left and right epididymis are normal. Rectal: Patient with  normal sphincter tone. Anus and perineum without scarring or rashes. No rectal masses are appreciated. Prostate is approximately 60 + grams, no nodules are appreciated. Seminal vesicles are normal. Skin: No rashes, bruises or suspicious lesions. Lymph: No cervical or inguinal adenopathy. Neurologic: Grossly intact, no focal deficits, moving all 4 extremities. Psychiatric: Normal mood and affect.   Laboratory Data: PSA History:  5.77 ng/mL on 04/06/2013  3.00 ng/mL on 08/09/2013    1.9 ng/mL on 08/17/2014     1.7 ng/mL on 02/13/2015  1.9 ng/mL on 08/17/2015  2.00 ng/mL on 02/15/2016  1.6 ng/mL on 08/20/2016  1.79 ng/mL in 02/04/2017  I have reviewed the labs.  Assessment & Plan:   1. BPH with LUTS  - IPSS score is 10/2, it is improving  - Continue conservative management, avoiding bladder irritants and timed voiding's  - Continue tamsulosin 0.4 mg daily and finasteride 5 mg daily  - RTC in 12 months for IPSS, PSA and exam   2. History of elevated PSA  - current PSA was 1.79  - RTC in 12 months for PSA and exam  3. Family history of  prostate cancer:   Father with prostate cancer.  See above.    4. Nocturia  - patient has had a sleep study and he does not have sleep apnea  - did not try the Noctiva due to potential side effects   Return in about 1 year (around 02/20/2018) for IPSS, PSA and exam.  Zara Council, [facility]  [place] [address] [place], [place] [address] [phone]

## 2017-02-20 ENCOUNTER — Encounter: Payer: Self-pay | Admitting: Urology

## 2017-02-20 ENCOUNTER — Ambulatory Visit: Payer: Medicare Other | Admitting: Urology

## 2017-02-20 VITALS — BP 118/73 | HR 67 | Ht 71.0 in | Wt 175.1 lb

## 2017-02-20 DIAGNOSIS — R351 Nocturia: Secondary | ICD-10-CM | POA: Diagnosis not present

## 2017-02-20 DIAGNOSIS — Z87898 Personal history of other specified conditions: Secondary | ICD-10-CM | POA: Diagnosis not present

## 2017-02-20 DIAGNOSIS — Z8042 Family history of malignant neoplasm of prostate: Secondary | ICD-10-CM | POA: Diagnosis not present

## 2017-02-20 DIAGNOSIS — N138 Other obstructive and reflux uropathy: Secondary | ICD-10-CM | POA: Diagnosis not present

## 2017-02-20 DIAGNOSIS — N401 Enlarged prostate with lower urinary tract symptoms: Secondary | ICD-10-CM

## 2017-03-20 DIAGNOSIS — C44212 Basal cell carcinoma of skin of right ear and external auricular canal: Secondary | ICD-10-CM | POA: Insufficient documentation

## 2017-08-06 ENCOUNTER — Other Ambulatory Visit: Payer: Self-pay | Admitting: Internal Medicine

## 2017-08-06 DIAGNOSIS — R1012 Left upper quadrant pain: Secondary | ICD-10-CM

## 2017-08-19 ENCOUNTER — Ambulatory Visit
Admission: RE | Admit: 2017-08-19 | Discharge: 2017-08-19 | Disposition: A | Discharge status: Home / Self Care | Payer: Medicare Other | Source: Ambulatory Visit | Attending: Internal Medicine | Admitting: Internal Medicine

## 2017-08-19 DIAGNOSIS — R1012 Left upper quadrant pain: Secondary | ICD-10-CM | POA: Diagnosis not present

## 2017-08-20 IMAGING — CT CT CHEST W/O CM
2 of 3 series · 15 of 36 positions shown, 18 images · non-contrast
Comparison: None.

CLINICAL DATA: Shortness of breath with exertion for 6 months to 1
year with more recent worsening of symptoms. Initial encounter.

EXAM:
CT CHEST WITHOUT CONTRAST
TECHNIQUE: Multidetector CT imaging of the chest was performed following the
standard protocol without IV contrast.

[Series 2: routine chest wo · axial · 0.71mm/px · z∈[-764,-468]mm · 12 of 71 slices shown, 15 images]
[im 6/71  mediastinal]
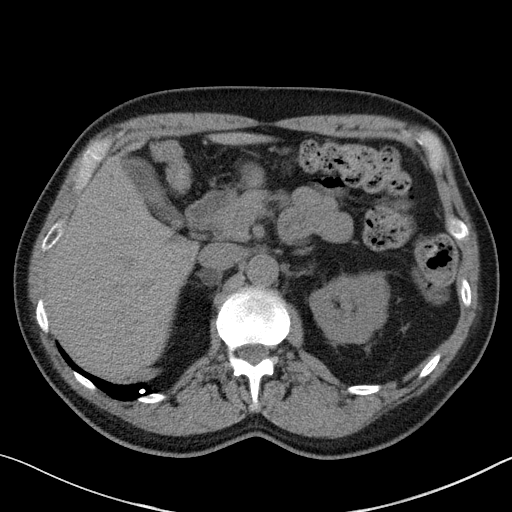
[im 6/71  lung]
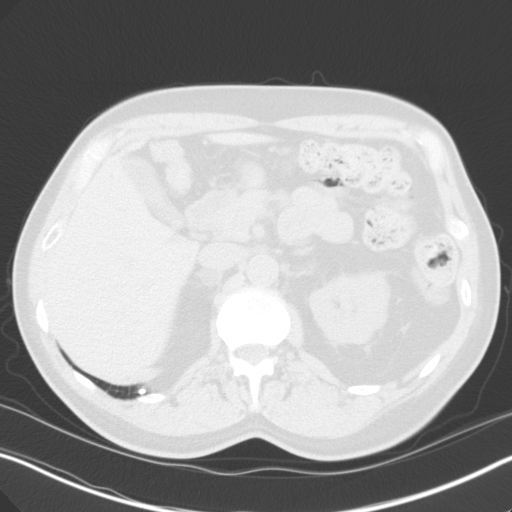
[im 11/71  lung]
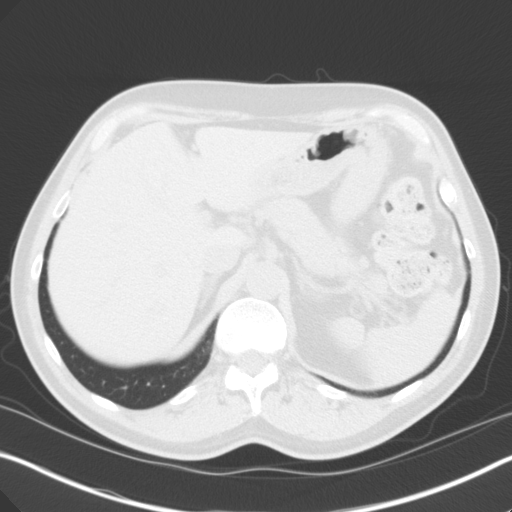
[im 16/71  lung]
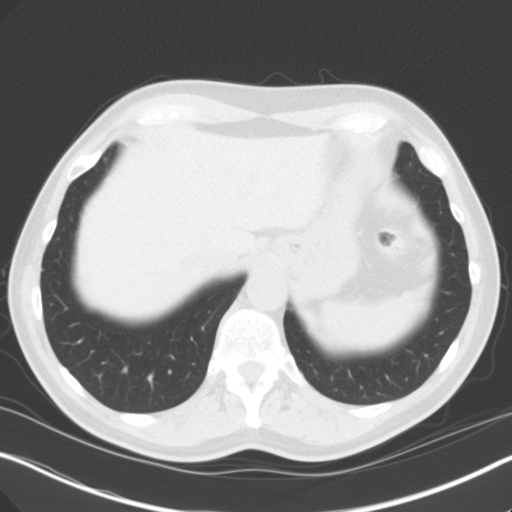
[im 21/71  lung]
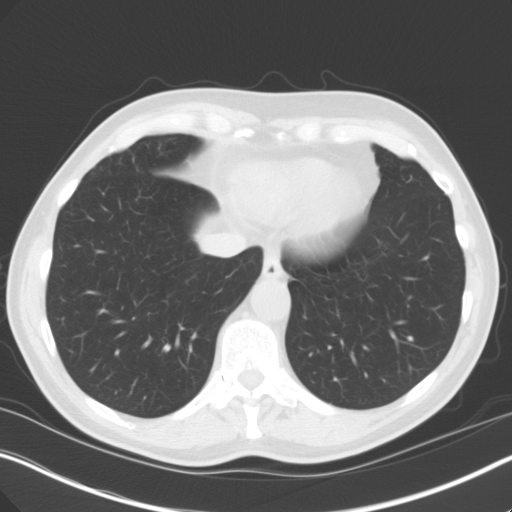
[im 26/71  mediastinal]
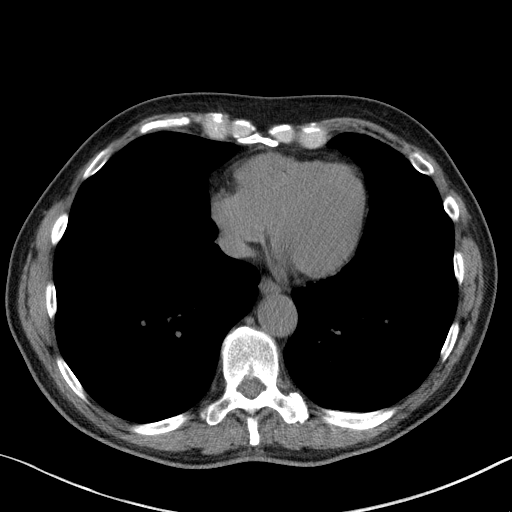
[im 26/71  lung]
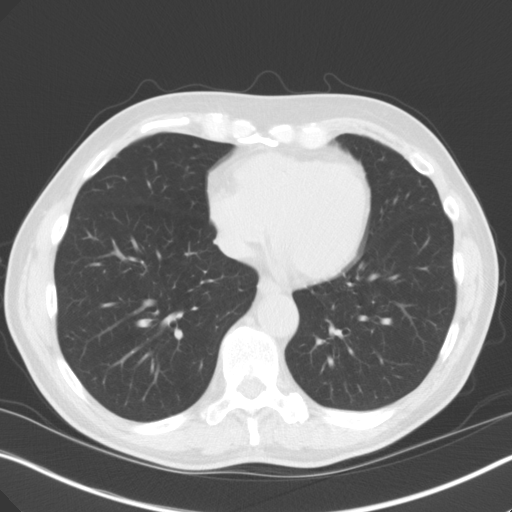
[im 32/71  lung]
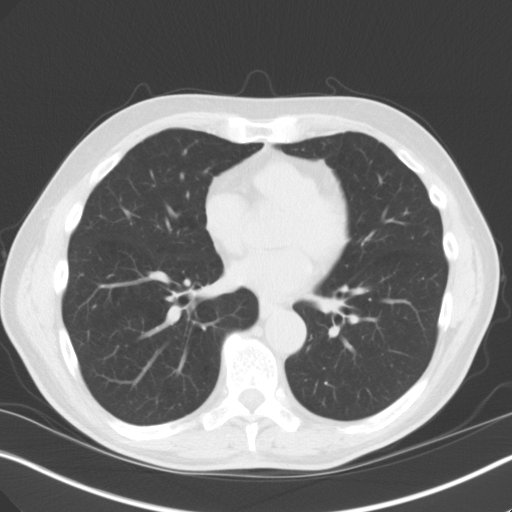
[im 39/71  lung]
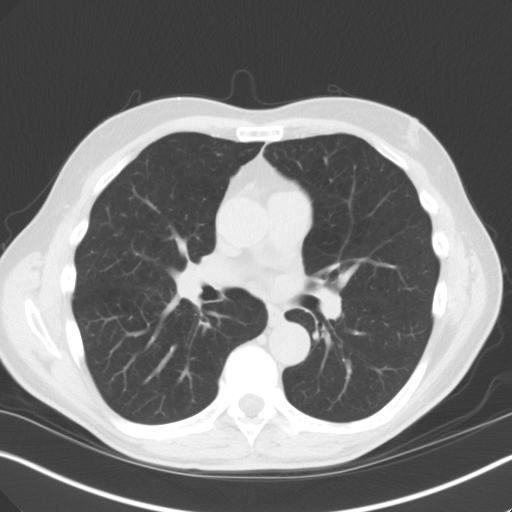
[im 45/71  lung]
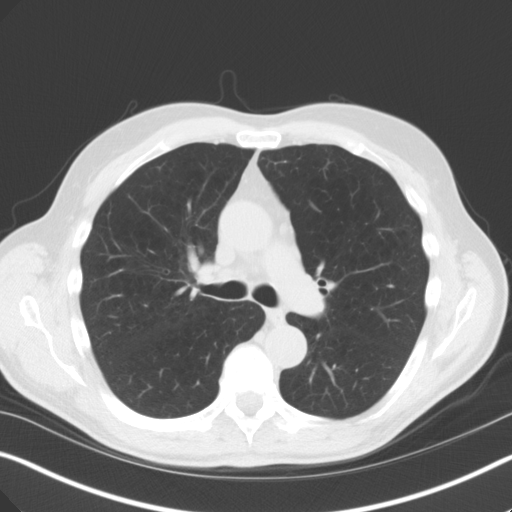
[im 50/71  mediastinal]
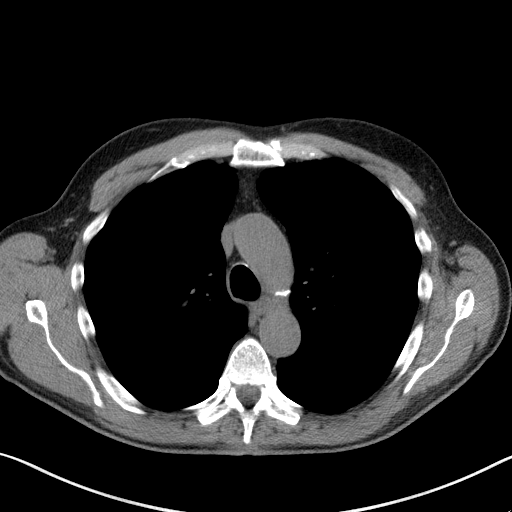
[im 50/71  lung]
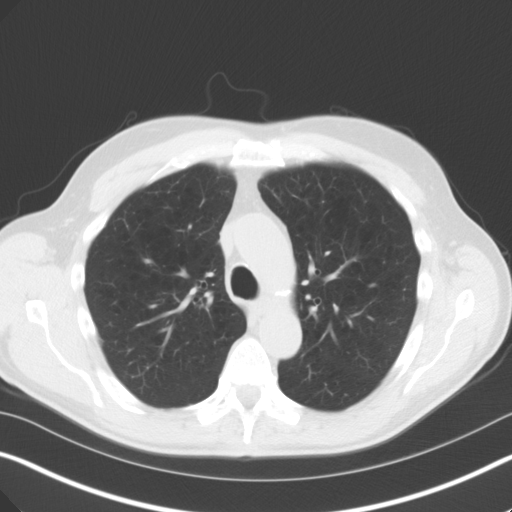
[im 55/71  lung]
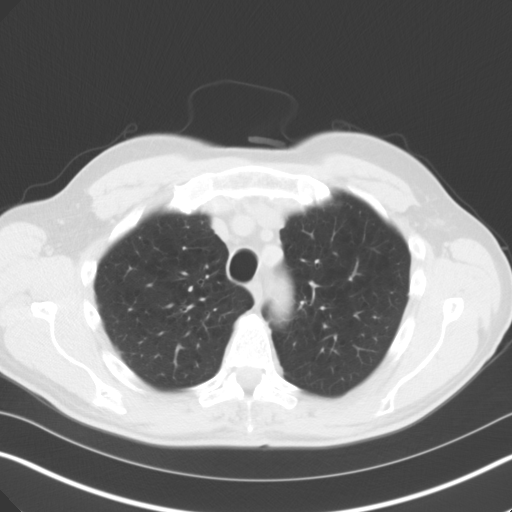
[im 60/71  lung]
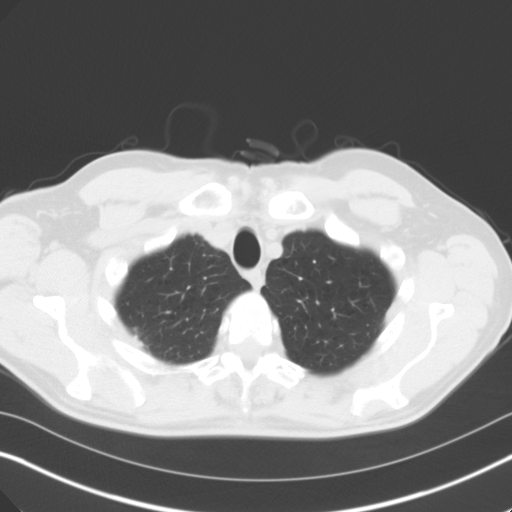
[im 65/71  lung]
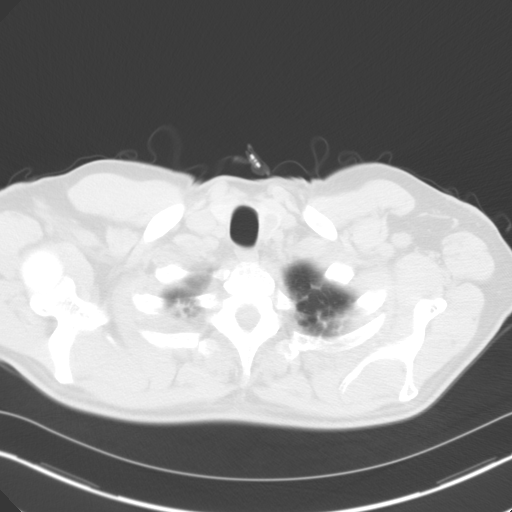

[Series 5: cor routine chest wo · coronal · 0.71mm/px · 3 of 134 slices shown]
[im 27/134  lung]
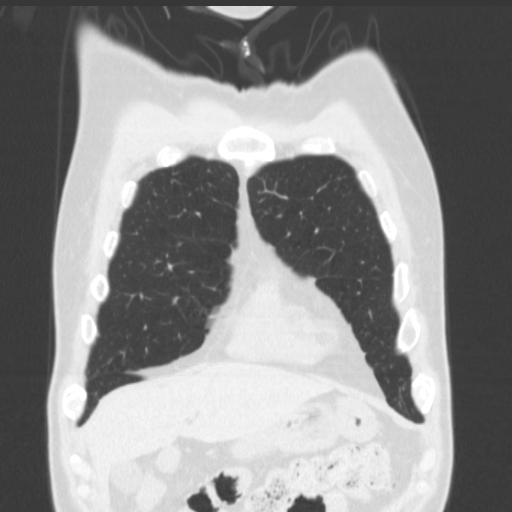
[im 54/134  lung]
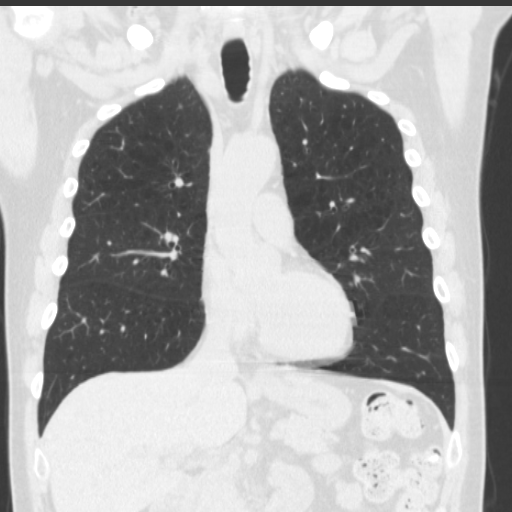
[im 80/134  lung]
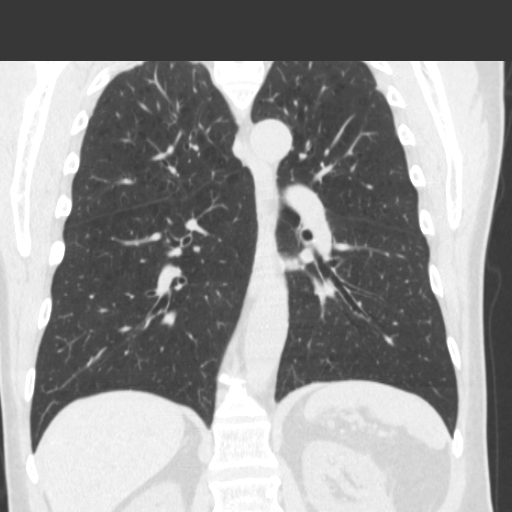

[15 of 36 positions shown; findings below may reference images not displayed]

FINDINGS: Mediastinum / Lymph Nodes: There is no axillary lymphadenopathy.
cm left thyroid nodule noted. No mediastinal lymphadenopathy. There
is no hilar lymphadenopathy. The heart size is normal. No
pericardial effusion. The esophagus has normal imaging features.

Lungs / Pleura: Centrilobular emphysema noted bilaterally. There is
biapical pleural-parenchymal scarring, right greater than left.
Calcified granuloma noted in the deep posterior right costophrenic
sulcus. Lungs are otherwise clear without focal airspace
consolidation. No pulmonary edema or pleural effusion.

Upper Abdomen: Visualized portions of the liver and spleen have
normal uninfused features. Right adrenal adenomas measure up to 14
mm diameter. Benign adrenal adenoma on the left measures up to
cm

[HOSPITAL] / Soft Tissues: Bone windows reveal no worrisome lytic or
sclerotic osseous lesions.
IMPRESSION: 1. Emphysema with biapical pleural-parenchymal scarring.
2. 1.8 cm left thyroid nodule. Thyroid ultrasound could be used to
further characterize.
3. Bilateral benign adrenal adenomas.

## 2018-03-10 ENCOUNTER — Other Ambulatory Visit: Payer: Medicare Other

## 2018-03-11 DIAGNOSIS — I10 Essential (primary) hypertension: Secondary | ICD-10-CM | POA: Insufficient documentation

## 2018-03-11 DIAGNOSIS — E785 Hyperlipidemia, unspecified: Secondary | ICD-10-CM | POA: Insufficient documentation

## 2018-03-12 ENCOUNTER — Other Ambulatory Visit: Payer: Self-pay

## 2018-03-12 ENCOUNTER — Encounter: Payer: Self-pay | Admitting: Urology

## 2018-03-12 ENCOUNTER — Ambulatory Visit: Payer: Medicare Other | Admitting: Urology

## 2018-03-12 VITALS — BP 124/78 | HR 72 | Ht 71.0 in | Wt 179.0 lb

## 2018-03-12 DIAGNOSIS — N138 Other obstructive and reflux uropathy: Secondary | ICD-10-CM

## 2018-03-12 DIAGNOSIS — R310 Gross hematuria: Secondary | ICD-10-CM

## 2018-03-12 DIAGNOSIS — N401 Enlarged prostate with lower urinary tract symptoms: Secondary | ICD-10-CM

## 2018-03-12 DIAGNOSIS — R351 Nocturia: Secondary | ICD-10-CM | POA: Diagnosis not present

## 2018-03-12 DIAGNOSIS — Z8042 Family history of malignant neoplasm of prostate: Secondary | ICD-10-CM | POA: Diagnosis not present

## 2018-03-12 LAB — BLADDER SCAN AMB NON-IMAGING

## 2018-03-12 NOTE — Progress Notes (Signed)
03/12/18 10:30 AM   Ryan Mejia Nov 21, 1945 [id number]  Referring provider: Idelle Crouch, MD [facility] [place],  [address]  Chief Complaint  Patient presents with  . Benign Prostatic Hypertrophy    HPI: Patient is a 73 year old Caucasian male with a history of elevated PSA, nocturia and BPH with LUTS who presents today for a 1 year follow up.  BPH WITH LUTS His IPSS score today is 12/2, which is moderate lower urinary tract symptomatology. He is mostly satisfied with his quality life due to his urinary symptoms.  His previous I PSS score was 10.  He did not try the Noctiva due to the fear of side effects.  He did not notice an improvement with the branded finasteride and tamsulosin.   Today, he denies any dysuria or suprapubic pain.   He also denies any recent fevers, chills, nausea or vomiting.   He has a family history of PCa, with his father being diagnosed with prostate cancer.    IPSS    Row Name 03/12/18 0900         International Prostate Symptom Score   How often have you had the sensation of not emptying your bladder?  Less than 1 in 5     How often have you had to urinate less than every two hours?  About half the time     How often have you found you stopped and started again several times when you urinated?  Not at All     How often have you found it difficult to postpone urination?  Less than half the time     How often have you had a weak urinary stream?  More than half the time     How often have you had to strain to start urination?  Not at All     How many times did you typically get up at night to urinate?  2 Times     Total IPSS Score  12       Quality of Life due to urinary symptoms   If you were to spend the rest of your life with your urinary condition just the way it is now how would you feel about that?  Mostly Satisfied        Score:  1-7 Mild 8-19 Moderate 20-35 Severe  History of elevated  PSA Patient  was referred to [place] in 2015  for PSA of 5.77 ng/mL by his primary care physician.  He was initiated on finasteride at that time. His most recent PSA was 1.25 ng/mL on 02/04/2018.    Gross Hematuria He reports that he has passed several blood clots recently which was not painful and has no history of kidney stones. He does have a FHx of kidney stones.   PMH: Past Medical History:  Diagnosis Date  . BPH (benign prostatic hyperplasia)   . Cataracts, bilateral   . Chronic prostatitis   . Elevated PSA   . Goiter   . Goiter, nodular   . Herpes simplex   . History of colonic polyps   . Hyperlipidemia   . Hypertension   . Labyrinthitis   . Nocturia   . Skin cancer, basal cell     Surgical History: Past Surgical History:  Procedure Laterality Date  . COLONOSCOPY WITH PROPOFOL N/A 04/18/2016   Procedure: COLONOSCOPY WITH PROPOFOL;  Surgeon: Lollie Sails, MD;  Location: [facility] ENDOSCOPY;  Service: Endoscopy;  Laterality: N/A;  .  SKIN CANCER DESTRUCTION    . TONSILLECTOMY     child    Home Medications:  Allergies as of 03/12/2018      Reactions   Lipitor [atorvastatin]    Muscle aches      Medication List       Accurate as of March 12, 2018 10:30 AM. Always use your most recent med list.        acyclovir 200 MG capsule Commonly known as:  ZOVIRAX Take 200 mg by mouth 5 (five) times daily. Reported on 02/15/2015   finasteride 5 MG tablet Commonly known as:  PROSCAR TAKE 1 TABLET BY MOUTH  DAILY   ketoconazole 2 % shampoo Commonly known as:  NIZORAL Shampoo head, leave on for 5 minutes, then rinse off. Use daily until controlled, then use 3 times a week.   polycarbophil 625 MG tablet Commonly known as:  FIBERCON Take 625 mg by mouth daily.   PROBIOTIC ADVANCED Caps Take by mouth.   tamsulosin 0.4 MG Caps capsule Commonly known as:  FLOMAX Take 1 capsule (0.4 mg total) by mouth daily.       Allergies:  Allergies  Allergen Reactions  . Lipitor  [Atorvastatin]     Muscle aches    Family History: Family History  Problem Relation Age of Onset  . Prostate cancer Father   . Breast cancer Mother   . Kidney disease Neg Hx   . Bladder Cancer Neg Hx   . Kidney cancer Neg Hx     Social History:  reports that he has been smoking cigarettes and e-cigarettes. He has a 20.00 pack-year smoking history. He has never used smokeless tobacco. He reports current alcohol use of about 2.0 standard drinks of alcohol per week. He reports that he does not use drugs.  ROS: UROLOGY Frequent Urination?: Yes Hard to postpone urination?: Yes Burning/pain with urination?: No Get up at night to urinate?: No Leakage of urine?: No Urine stream starts and stops?: No Trouble starting stream?: No Do you have to strain to urinate?: No Blood in urine?: Yes Urinary tract infection?: No Sexually transmitted disease?: No Injury to kidneys or bladder?: No Painful intercourse?: No Weak stream?: No Erection problems?: No Penile pain?: No Gastrointestinal Nausea?: No Vomiting?: No Indigestion/heartburn?: No Diarrhea?: No Constipation?: No Constitutional Fever: No Night sweats?: No Weight loss?: No Fatigue?: No Skin Skin rash/lesions?: No Itching?: No Eyes Blurred vision?: No Double vision?: No Ears/Nose/Throat Sore throat?: No Sinus problems?: No Hematologic/Lymphatic Swollen glands?: No Easy bruising?: No Cardiovascular Leg swelling?: No Chest pain?: No Respiratory Cough?: No Shortness of breath?: No Endocrine Excessive thirst?: No Musculoskeletal Back pain?: No Joint pain?: No Neurological Headaches?: No Dizziness?: No Psychologic Depression?: No Anxiety?: No   Physical Exam: BP 124/78   Pulse 72   Ht 5\' 11"  (1.803 m)   Wt 179 lb (81.2 kg)   BMI 24.97 kg/m   Constitutional:  Well nourished. Alert and oriented, No acute distress. HEENT: [place] AT, moist mucus membranes.  Trachea midline, no masses. Cardiovascular: No  clubbing, cyanosis, or edema. Respiratory: Normal respiratory effort, no increased work of breathing. GU: No CVA tenderness.  No bladder fullness or masses.  Patient with circumcised.Urethral meatus is patent.  No penile discharge. No penile lesions or rashes. Scrotum without lesions, cysts, rashes and/or edema.  Testicles are located scrotally bilaterally. No masses are appreciated in the testicles. Left and right epididymis are normal. Rectal: Patient with  normal sphincter tone. Anus and perineum without scarring or rashes.  No rectal masses are appreciated. Prostate is approximately 60 grams, no nodules are appreciated. Could only palpate mid-portion and apex. Skin: No rashes, bruises or suspicious lesions. Neurologic: Grossly intact, no focal deficits, moving all 4 extremities. Psychiatric: Normal mood and affect.  Laboratory Data: PSA History:  5.77 ng/mL on 04/06/2013  3.00 ng/mL on 08/09/2013    1.9 ng/mL on 08/17/2014     1.7 ng/mL on 02/13/2015  1.9 ng/mL on 08/17/2015  2.00 ng/mL on 02/15/2016  1.6 ng/mL on 08/20/2016  1.79 ng/mL in 02/04/2017  1.25 ng/mL in 02/04/2018  I have reviewed the labs.  Assessment & Plan:   1. Gross Hematuria  - I explained to the patient that there are a number of causes that can be associated with blood in the urine, such as stones, BPH, UTI's, damage to the urinary tract and/or cancer.  - At this time, I felt that the patient warranted further urologic evaluation and AUA guidelines state that a CT urogram is the preferred imaging study to evaluate hematuria.  - I explained to the patient that a contrast material will be injected into a vein and that in rare instances, an allergic reaction can result and may even life threatening   The patient denies any allergies to contrast, iodine and/or seafood and is not taking metformin.  - Following the imaging study,  I've recommended a cystoscopy. I described how this is performed, typically in an office  setting with a flexible cystoscope. We described the risks, benefits, and possible side effects, the most common of which is a minor amount of blood in the urine and/or burning which usually resolves in 24 to 48 hours.    - The patient had the opportunity to ask questions which were answered. Based upon this discussion, the patient is willing to proceed. Therefore, I've ordered: a CT Urogram and cystoscopy.  - The patient will return following all of the above for discussion of the results.   - UA - negative at PCP's office  2. BPH with LUTS  - IPSS score is 12/2, it is stable  - Continue conservative management, avoiding bladder irritants and timed voiding's  - Continue tamsulosin 0.4 mg daily and finasteride 5 mg daily - refilled by PCP  - RTC in 12 months for IPSS, PSA and exam   3. History of elevated PSA  - most recent PSA was 1.25 from 02/04/18  - RTC in 12 months for PSA and exam  4. Family history of prostate cancer:   Father with prostate cancer.  See above.    5. Nocturia  - patient has had a sleep study and he does not have sleep apnea  - did not try the Noctiva due to potential side effects   - not bothersome at this time       Return for CTU report and cysto .  Zara Council, PA-C  [facility] [address] [place], [place] [address] [phone]  I, Lucas Mallow, am acting as a Education administrator for [employer],  I have reviewed the above documentation for accuracy and completeness, and I agree with the above.    Zara Council, PA-C

## 2018-04-02 ENCOUNTER — Ambulatory Visit
Admission: RE | Admit: 2018-04-02 | Discharge: 2018-04-02 | Disposition: A | Discharge status: Home / Self Care | Payer: Medicare Other | Source: Ambulatory Visit | Attending: Urology | Admitting: Urology

## 2018-04-02 DIAGNOSIS — R310 Gross hematuria: Secondary | ICD-10-CM

## 2018-04-02 LAB — POCT I-STAT CREATININE: Creatinine, Ser: 0.8 mg/dL (ref 0.61–1.24)

## 2018-04-02 MED ORDER — IOPAMIDOL (ISOVUE-300) INJECTION 61%
125.0000 mL | Freq: Once | INTRAVENOUS | Status: AC | PRN
  Administered 2018-04-02: 125 mL via INTRAVENOUS

## 2018-04-07 ENCOUNTER — Ambulatory Visit (INDEPENDENT_AMBULATORY_CARE_PROVIDER_SITE_OTHER): Payer: Medicare Other | Admitting: Urology

## 2018-04-07 ENCOUNTER — Encounter: Payer: Self-pay | Admitting: Urology

## 2018-04-07 VITALS — BP 146/80 | HR 66 | Ht 71.0 in | Wt 177.0 lb

## 2018-04-07 DIAGNOSIS — D3500 Benign neoplasm of unspecified adrenal gland: Secondary | ICD-10-CM

## 2018-04-07 DIAGNOSIS — R310 Gross hematuria: Secondary | ICD-10-CM

## 2018-04-07 LAB — URINALYSIS, COMPLETE
Bilirubin, UA: NEGATIVE
Glucose, UA: NEGATIVE
Ketones, UA: NEGATIVE
NITRITE UA: NEGATIVE
PH UA: 7 (ref 5.0–7.5)
Protein, UA: NEGATIVE
RBC UA: NEGATIVE
SPEC GRAV UA: 1.015 (ref 1.005–1.030)
UUROB: 0.2 mg/dL (ref 0.2–1.0)

## 2018-04-07 NOTE — Patient Instructions (Signed)
You have small adrenal adenomas bilaterally. These are usually non-functional and do not need to be re-imaged, however we need to do some special blood tests to be sure they are not secreting abnormal chemicals or hormones. A referral was placed to endocrinology for these tests.   Transurethral Resection of the Prostate  Transurethral resection of the prostate (TURP) is the removal (resection) of part of the gland that produces semen (prostate gland). This procedure is done to treat benign prostatic hyperplasia (BPH). BPH is an abnormal, noncancerous (benign) increase in the number of cells that make up the prostate tissue. BPH causes the prostate to get bigger. The enlarged prostate can push against or block the tube that drains urine from the bladder out of the body (urethra). BPH can affect normal urine flow by causing bladder infections, difficulty controlling bladder function, and difficulty emptying the bladder. The goal of TURP is to remove enough prostate tissue to allow for a normal flow of urine. In a transurethral resection, a thin telescope with a light, a tiny camera, and an electric cutting edge (resectoscope) is passed through the urethra and into the prostate. The opening of the urethra is at the end of the penis. Tell a health care provider about:  Any allergies you have.  All medicines you are taking, including vitamins, herbs, eye drops, creams, and over-the-counter medicines.  Any problems you or family members have had with anesthetic medicines.  Any blood disorders you have.  Any surgeries you have had.  Any medical conditions you have.  Any prostate infections you have had. What are the risks? Generally, this is a safe procedure. However, problems may occur, including:  Infection.  Bleeding.  Allergic reactions to medicines.  Damage to other structures or organs, such as: ? The urethra. ? The bladder. ? Muscles that surround the prostate.  Difficulty getting  an erection.  Difficulty controlling urination (incontinence).  Scarring, which may cause problems with urine flow. What happens before the procedure?  Follow instructions from your health care provider about eating or drinking restrictions.  Ask your health care provider about: ? Changing or stopping your regular medicines. This is especially important if you are taking diabetes medicines or blood thinners. ? Taking medicines such as aspirin and ibuprofen. These medicines can thin your blood. Do not take these medicines before your procedure if your health care provider instructs you not to.  You may have a physical exam.  You may have a blood or urine sample taken.  You may be given antibiotic medicine to help prevent infection.  Ask your health care provider how your surgical site will be marked or identified.  Plan to have someone take you home after the procedure. You may not be able to drive for up to 10 days after your procedure. What happens during the procedure?  To reduce your risk of infection: ? Your health care team will wash or sanitize their hands. ? Your skin will be washed with soap.  An IV tube will be inserted into one of your veins.  You will be given one or more of the following: ? A medicine to help you relax (sedative). ? A medicine to make you fall asleep (general anesthetic). ? A medicine that is injected into your spine to numb the area below and slightly above the injection site (spinal anesthetic).  Your legs will be placed in foot rests (stirrups) so that your legs are apart and your knees are bent.  The resectoscope will be  passed through your urethra to your prostate.  Parts of your prostate will be resected using the cutting edge of the resectoscope.  The resectoscope will be removed.  A thin, flexible tube (catheter) will be passed through your urethra and into your bladder. The catheter will drain urine into a bag outside of your  body. ? Fluid may be passed through the catheter to keep the catheter open. The procedure may vary among health care providers and hospitals. What happens after the procedure?  Your blood pressure, heart rate, breathing rate, and blood oxygen level will be monitored often until the medicines you were given have worn off.  You may continue to receive fluids and medicines through an IV tube.  You may have some pain. Pain medicine will be available to help you.  You will have a catheter draining your urine. ? You may have blood in your urine. Your catheter may be kept in until your urine is clear. ? Your urinary drainage will be monitored. If necessary, your bladder may be rinsed out (irrigated) through your catheter.  You will be encouraged to walk around as soon as possible.  You may have to wear compression stockings. These stockings help prevent blood clots and reduce swelling in your legs.  Do not drive for 24 hours if you received a sedative. This information is not intended to replace advice given to you by your health care provider. Make sure you discuss any questions you have with your health care provider. Document Released: 02/18/2005 Document Revised: 10/22/2015 Document Reviewed: 11/10/2014 Elsevier Interactive Patient Education  2019 [employer]   Transurethral Resection of the Prostate, Care After Refer to this sheet in the next few weeks. These instructions provide you with information about caring for yourself after your procedure. Your health care provider may also give you more specific instructions. Your treatment has been planned according to current medical practices, but problems sometimes occur. Call your health care provider if you have any problems or questions after your procedure. What can I expect after the procedure? After the procedure, it is common to have:  Mild pain in your lower abdomen.  Soreness or mild discomfort in your penis from having the  catheter inserted during the procedure.  A feeling of urgency when you need to urinate.  A small amount of blood in your urine. You may notice some small blood clots in your urine. These are normal. Follow these instructions at home: Medicines   Take over-the-counter and prescription medicines only as told by your health care provider.  Do not drive or operate heavy machinery while taking prescription pain medicine.  Do not drive for 24 hours if you received a sedative.  If you were prescribed antibiotic medicine, take it as told by your health care provider. Do not stop taking the antibiotic even if you start to feel better. Activity  Return to your normal activities as told by your health care provider. Ask your health care provider what activities are safe for you.  Do not lift anything that is heavier than 10 lb (4.5 kg) for 3 weeks after your procedure, or as long as told by your health care provider.  Avoid intense physical activity for as long as told by your health care provider.  Avoid sitting for a long time without moving. Get up and move around one or more times every few hours. This helps to prevent blood clots. You may increase your physical activity gradually as you start to feel better.  Lifestyle  Do not drink alcohol for as long as told by your health care provider. This is especially important if you are taking prescription pain medicines.  Do not engage in sexual activity until your health care provider says that you can do this. General instructions  Do not take baths, swim, or use a hot tub until your health care provider approves.  Drink enough fluid to keep your urine clear or pale yellow.  Urinate as soon as you feel the need to. Do not try to hold your urine for long periods of time.  If your health care provider approves, you may take a stool softener for 2-3 weeks to prevent you from straining to have a bowel movement.  Wear compression stockings as  told by your health care provider. These stockings help to prevent blood clots and reduce swelling in your legs.  Keep all follow-up visits as told by your health care provider. This is important. Contact a health care provider if:  You have difficulty urinating.  You have a fever.  You have pain that gets worse or does not improve with medicine.  You have blood in your urine that does not go away after 1 week of resting and drinking more fluids.  You have swelling in your penis or testicles. Get help right away if:  You are unable to urinate.  You are having more blood clots in your urine instead of fewer.  You have: ? Large blood clots. ? A lot of blood in your urine. ? Pain in your back or lower abdomen. ? Pain or swelling in your legs. ? Chills and you are shaking. This information is not intended to replace advice given to you by your health care provider. Make sure you discuss any questions you have with your health care provider. Document Released: 02/18/2005 Document Revised: 04/03/2016 Document Reviewed: 11/10/2014 Elsevier Interactive Patient Education  2019 [employer].

## 2018-04-07 NOTE — Progress Notes (Signed)
Cystoscopy Procedure Note:  Indication: Gross hematuria, BPH  After informed consent and discussion of the procedure and its risks, Ryan Mejia was positioned and prepped in the standard fashion. Cystoscopy was performed with a flexible cystoscope. The urethra, bladder neck and entire bladder was visualized in a standard fashion. The prostate was moderate in size with friable blood vessels, high bladder neck. The ureteral orifices were visualized in their normal location and orientation.  Vision partially obscured by prostatic bleeding, however no obvious bladder tumors or lesions.  Imaging: CT urogram dated 04/02/2018 personally reviewed.  No hydronephrosis, urolithiasis, renal masses, or filling defects.  There are bilateral adrenal adenomas.  Prostate measures 65 cc  Findings: Friable blood vessels at prostate, bladder mucosa normal  Assessment and Plan: -Referral to endocrinology for adrenal adenoma function work-up -We discussed bladder outlet procedures including TURP,HOLEP, and UroLift at length today. Risks and benefits discussed.  He would like to consider his options, and will call us if he would like to proceed with surgery.  RTC 1 year, sooner if would like to schedule outlet procedure  Nickolas Madrid, MD 04/07/2018

## 2019-03-04 DIAGNOSIS — Z720 Tobacco use: Secondary | ICD-10-CM | POA: Insufficient documentation

## 2019-04-06 ENCOUNTER — Encounter: Payer: Self-pay | Admitting: Urology

## 2019-04-06 ENCOUNTER — Ambulatory Visit: Payer: Medicare Other | Admitting: Urology

## 2019-04-06 ENCOUNTER — Other Ambulatory Visit: Payer: Self-pay

## 2019-04-06 VITALS — BP 145/82 | HR 64 | Ht 69.0 in | Wt 175.0 lb

## 2019-04-06 DIAGNOSIS — N401 Enlarged prostate with lower urinary tract symptoms: Secondary | ICD-10-CM

## 2019-04-06 DIAGNOSIS — I700 Atherosclerosis of aorta: Secondary | ICD-10-CM | POA: Insufficient documentation

## 2019-04-06 DIAGNOSIS — N138 Other obstructive and reflux uropathy: Secondary | ICD-10-CM | POA: Diagnosis not present

## 2019-04-06 NOTE — Progress Notes (Signed)
   04/06/2019 10:23 AM   Ryan Mejia June 26, 1945 [id number]  Reason for visit: Follow up BPH/LUTS/hx of gross hematuria  HPI: I saw [name] back in urology clinic today for BPH follow-up.  He is a healthy 74 year old male who I last saw for cystoscopy in February 2020 for gross hematuria work-up.  Cystoscopy at that time showed an enlarged prostate with friable blood vessels, but normal bladder mucosa.  CT urogram on 04/02/2018 was notable for a 65 g prostate and bilateral adrenal adenomas.  He did see endocrinology for work-up of these adrenal adenomas which were benign.  He has a long history of urinary symptoms on maximal medical therapy with Flomax and finasteride.  His primary complaint is nocturia 1-2 times per night.  He has tried to stop Flomax before, but notes worsening urinary symptoms with frequency and weak stream when he is off his Flomax.  He denies any history of urinary tract infections or urinary retention.  He denies any gross hematuria since her last visit.  Overall, he feels he is doing very well.  He mentions today that he has had some tarry stools and some irregular bowel movements.  I recommended contacting his gastroenterologist for further evaluation and consideration of colonoscopy.  We again discussed options for his urinary symptoms including continuing maximal medical therapy versus outlet procedure with HoLEP or other bladder outlet options.  He would like to continue on medications at this time which is very reasonable.  We discussed return precautions including recurrent gross hematuria, UTIs, retention, or worsening urinary symptoms.    RTC 1 year with PVR/IPSS  A total of 20 minutes were spent face-to-face with the patient, greater than 50% was spent in patient education, counseling, and coordination of care regarding BPH and urinary symptoms.  Billey Co, [facility] [address] [place], [place]  [address] [phone]

## 2019-04-06 NOTE — Patient Instructions (Signed)

## 2019-04-08 ENCOUNTER — Ambulatory Visit: Payer: Medicare Other | Admitting: Urology

## 2020-01-19 ENCOUNTER — Other Ambulatory Visit: Payer: Self-pay | Admitting: Physician Assistant

## 2020-01-19 DIAGNOSIS — M546 Pain in thoracic spine: Secondary | ICD-10-CM

## 2020-01-22 ENCOUNTER — Ambulatory Visit
Admission: RE | Admit: 2020-01-22 | Discharge: 2020-01-22 | Disposition: A | Discharge status: Home / Self Care | Payer: Medicare Other | Source: Ambulatory Visit | Attending: Physician Assistant | Admitting: Physician Assistant

## 2020-01-22 ENCOUNTER — Other Ambulatory Visit: Payer: Self-pay

## 2020-01-22 DIAGNOSIS — M546 Pain in thoracic spine: Secondary | ICD-10-CM | POA: Diagnosis not present

## 2020-02-02 ENCOUNTER — Other Ambulatory Visit (HOSPITAL_COMMUNITY): Payer: Self-pay | Admitting: Physical Medicine & Rehabilitation

## 2020-02-02 ENCOUNTER — Other Ambulatory Visit: Payer: Self-pay | Admitting: Physical Medicine & Rehabilitation

## 2020-02-02 DIAGNOSIS — M5412 Radiculopathy, cervical region: Secondary | ICD-10-CM

## 2020-02-04 ENCOUNTER — Ambulatory Visit
Admission: RE | Admit: 2020-02-04 | Discharge: 2020-02-04 | Disposition: A | Discharge status: Home / Self Care | Payer: Medicare Other | Source: Ambulatory Visit | Attending: Physical Medicine & Rehabilitation | Admitting: Physical Medicine & Rehabilitation

## 2020-02-04 ENCOUNTER — Other Ambulatory Visit: Payer: Self-pay

## 2020-02-04 DIAGNOSIS — M5412 Radiculopathy, cervical region: Secondary | ICD-10-CM | POA: Diagnosis present

## 2020-03-14 ENCOUNTER — Other Ambulatory Visit: Payer: Self-pay | Admitting: Internal Medicine

## 2020-03-14 DIAGNOSIS — F17210 Nicotine dependence, cigarettes, uncomplicated: Secondary | ICD-10-CM

## 2020-03-14 DIAGNOSIS — Z720 Tobacco use: Secondary | ICD-10-CM

## 2020-03-22 ENCOUNTER — Other Ambulatory Visit: Payer: Self-pay

## 2020-03-22 ENCOUNTER — Ambulatory Visit
Admission: RE | Admit: 2020-03-22 | Discharge: 2020-03-22 | Disposition: A | Discharge status: Home / Self Care | Payer: Medicare Other | Source: Ambulatory Visit | Attending: Internal Medicine | Admitting: Internal Medicine

## 2020-03-22 DIAGNOSIS — Z720 Tobacco use: Secondary | ICD-10-CM | POA: Insufficient documentation

## 2020-03-22 DIAGNOSIS — F17210 Nicotine dependence, cigarettes, uncomplicated: Secondary | ICD-10-CM | POA: Diagnosis present

## 2020-03-23 ENCOUNTER — Telehealth: Payer: Self-pay | Admitting: Acute Care

## 2020-03-23 NOTE — Telephone Encounter (Signed)
-----   Message from Annie Paras sent at 03/23/2020  9:36 AM EST ----- Regarding: Radiology Follow Up Lung-RADS 2S, benign appearance or behavior. Continue annual screening with low-dose chest CT without contrast in 12 months.

## 2020-03-23 NOTE — Telephone Encounter (Signed)
[facility]:  We were contacted by the [facility].  Recommendations:  Dr. Doy Hutching, if you are planning on lung cancer screening  independently from the [facility], please place follow up for annual screening next due 03/2021. Alternately you can refer to [facility], [place], [place] Appointment requested: Per Dr. Doy Hutching, PCP Time frame: Annual screening due 03/2021  [facility] 03/23/2020 10:01 AM

## 2020-04-04 ENCOUNTER — Ambulatory Visit: Payer: Medicare Other | Admitting: Urology

## 2020-04-11 ENCOUNTER — Ambulatory Visit: Payer: Medicare Other | Admitting: Urology

## 2020-06-06 ENCOUNTER — Encounter: Payer: Self-pay | Admitting: Urology

## 2020-06-06 ENCOUNTER — Other Ambulatory Visit: Payer: Self-pay

## 2020-06-06 ENCOUNTER — Ambulatory Visit: Payer: Medicare Other | Admitting: Urology

## 2020-06-06 VITALS — BP 151/89 | HR 73 | Ht 71.0 in | Wt 177.0 lb

## 2020-06-06 DIAGNOSIS — R351 Nocturia: Secondary | ICD-10-CM

## 2020-06-06 DIAGNOSIS — N401 Enlarged prostate with lower urinary tract symptoms: Secondary | ICD-10-CM | POA: Diagnosis not present

## 2020-06-06 DIAGNOSIS — N138 Other obstructive and reflux uropathy: Secondary | ICD-10-CM

## 2020-06-06 LAB — BLADDER SCAN AMB NON-IMAGING

## 2020-06-06 NOTE — Patient Instructions (Signed)
Prostatic Urethral Lift, Care After This sheet gives you information about how to care for yourself after your procedure. Your health care provider may also give you more specific instructions. If you have problems or questions, contact your health care provider. What can I expect after the procedure? After the procedure, it is common to have:  Discomfort or burning when urinating.  An increased urge to urinate.  More frequent urination.  Urine that is slightly blood-tinged. These symptoms should go away after a few days. Follow these instructions at home:  Take over-the-counter and prescription medicines only as told by your health care provider.  Do not drive for 24 hours if you were given a medicine to help you relax (sedative).  Do not drive or use heavy machinery while taking prescription pain medicine.  Do not lift anything that is heavier than 10 lb (4.5 kg) until your health care provider says that this is safe.  Return to your normal activities as told by your health care provider. Ask your health care provider what activities are safe for you. Ask when you can return to sexual activity.  Drink enough fluid to keep your urine clear or pale yellow.  Keep all follow-up visits as told by your health care provider. This is important.   Contact a health care provider if:  You have chills or a fever.  You have pain when passing urine.  You have bright red blood or blood clots in your urine.  You have difficulty passing urine.  You have leaking of urine (incontinence). Get help right away if:  You have chest pain or shortness of breath.  You have leg pain or swelling.  You cannot pass urine. Summary  After the procedure, it is common to have discomfort or burning when urinating, an increased urge to urinate, more frequent urination, and urine that is slightly blood-tinged.  Do not drive for 24 hours if you were given a medicine to help you relax (sedative). Do not  drive or use heavy machinery while taking prescription pain medicine.  Do not lift anything that is heavier than 10 lb (4.5 kg) until your health care provider says that this is safe.  Return to your normal activities as told by your health care provider. This information is not intended to replace advice given to you by your health care provider. Make sure you discuss any questions you have with your health care provider. Document Revised: 10/28/2019 Document Reviewed: 10/28/2019 Elsevier Patient Education  2021 [place]. Prostatic Urethral Lift  Prostatic urethral lift is a surgical procedure to relieve symptoms of prostate gland enlargement that occurs with age (benign prostatic hypertrophy, BPH). The part of the body that drains urine from the bladder (urethra) passes between the two lobes of the prostate. As the prostate enlarges, it can push on the urethra and cause problems with urinating. This procedure involves placing an implant that holds the prostate away from the urethra. The procedure is performed with a thin operating telescope (cystoscope) that is inserted through the tip of the penis and moved up the urethra to the prostate. This is less invasive than other procedures that require an incision. You may have this procedure if:  You have symptoms of BPH.  Your prostate is not severely enlarged.  Medicines to treat BPH are not working or not tolerated.  You want to avoid possible sexual side effects from medicines or other procedures that are used to treat BPH. Tell a health care provider about:  Any allergies you have.  All medicines you are taking, including vitamins, herbs, eye drops, creams, and over-the-counter medicines.  Previous problems you or members of your family have had with the use of anesthetics.  Any blood disorders you have.  Previous surgeries you have had.  Any medical conditions you have. What are the  risks?  Bleeding.  Infection.  Leaking of urine (incontinence).  Allergic reactions to medicines.  Return of BPH symptoms after 2 years, requiring more treatment. What happens before the procedure?  Ask your health care provider about: ? Changing or stopping your regular medicines. This is especially important if you are taking diabetes medicines or blood thinners. ? Taking medicines such as aspirin and ibuprofen. These medicines can thin your blood. Do not take these medicines before your procedure if your health care provider instructs you not to.  Follow instructions from your health care provider about eating or drinking restrictions.  Plan to have someone take you home from the hospital or clinic. What happens during the procedure?  To lower your risk of infection: ? Your health care team will wash or sanitize their hands. ? Your skin will be washed with soap.  An IV may be inserted into one of your veins.  You will be given one or more of the following: ? A medicine to help you relax (sedative). ? A medicine that is injected into your urethra to numb the area (local anesthetic). ? A medicine to make you fall asleep (general anesthetic).  A cystoscope will be inserted into your penis and moved through your urethra to your prostate.  A device will be inserted through the cystoscope and used to press the lobes of your prostate away from your urethra.  Implants will be inserted through the device to hold the lobes of your prostate in the widened position.  The device and cystoscope will be removed. The procedure may vary among health care providers and hospitals. What happens after the procedure?  Your blood pressure, heart rate, breathing rate, and blood oxygen level will be monitored until the medicines you were given have worn off.  Do not drive for 24 hours if you were given a sedative. Summary  Prostatic urethral lift is a surgical procedure to relieve symptoms  of prostate gland enlargement that occurs with age (benign prostatic hypertrophy, BPH).  The procedure is performed with a thin operating telescope (cystoscope) that is inserted through the tip of the penis and moved up the part of the body that drains urine from the bladder (urethra) to reach the prostate. This is less invasive than other procedures that require an incision.  Plan to have someone take you home from the hospital or clinic. You will not be allowed to drive for 24 hours if you were given a sedative during the procedure. This information is not intended to replace advice given to you by your health care provider. Make sure you discuss any questions you have with your health care provider. Document Revised: 10/28/2019 Document Reviewed: 10/28/2019 Elsevier Patient Education  [place].

## 2020-06-06 NOTE — Progress Notes (Signed)
   06/06/2020 12:46 PM   Ryan Mejia 03-18-1945 [id number]  Reason for visit: Follow up BPH and urinary symptoms, nocturia  HPI: [name] is a 75 year old male with a history of a gross hematuria work-up that showed a friable prostate, and prostate measured 65 g on CT in January 2020.  Cystoscopy showed an enlarged prostate with friable blood vessels, no median lobe, and a high bladder neck.  He is on maximal medical therapy with Flomax and finasteride.  His primary complaint is nocturia 2-4 times per night.  He notices a worsening urinary stream when he stops Flomax.  He denies a history of UTIs or retention.  He is interested in surgical options for his urinary symptoms.  He has previously been worked up for sleep apnea which was negative.  He denies any lower extremity edema in the evenings.  He minimizes fluid prior to bedtime.  PVR is normal today at 34 mL.  IPSS score today is 22, with quality of life mostly dissatisfied.  He is primarily interested in [facility].  We discussed surgical options including HOLEP and UroLift and the differences between the risks and benefit profile at length.  We discussed that UroLift is a 15 to 20-minute procedure done under anesthesia to pin open the obstructing lobes of the prostate.  Most patients will go home without a catheter, and we expect patients to have irritative urinary symptoms for about 1 to 2 weeks, but continual improvement after that.  He would likely be able to stop his Flomax and finasteride after either outlet procedure.  We discussed the nuances of nocturia, and that this can be multifactorial.  We could also consider voiding diary and potentially even desmopressin in the future if he has nocturnal polyuria.  Anticipate proceeding with UroLift, patient will call to schedule   Billey Co, MD  [facility] [address] [place], [place] [address] [phone]

## 2021-02-05 ENCOUNTER — Other Ambulatory Visit: Payer: Self-pay | Admitting: Internal Medicine

## 2021-02-05 DIAGNOSIS — R1031 Right lower quadrant pain: Secondary | ICD-10-CM

## 2021-02-12 ENCOUNTER — Ambulatory Visit
Admission: RE | Admit: 2021-02-12 | Discharge: 2021-02-12 | Disposition: A | Discharge status: Home / Self Care | Payer: Medicare Other | Source: Ambulatory Visit | Attending: Internal Medicine | Admitting: Internal Medicine

## 2021-02-12 DIAGNOSIS — R1032 Left lower quadrant pain: Secondary | ICD-10-CM | POA: Insufficient documentation

## 2021-02-12 DIAGNOSIS — R1031 Right lower quadrant pain: Secondary | ICD-10-CM | POA: Diagnosis present

## 2021-02-12 LAB — POCT I-STAT CREATININE: Creatinine, Ser: 1 mg/dL (ref 0.61–1.24)

## 2021-02-12 MED ORDER — IOHEXOL 300 MG/ML  SOLN
100.0000 mL | Freq: Once | INTRAMUSCULAR | Status: AC | PRN
  Administered 2021-02-12: 100 mL via INTRAVENOUS

## 2021-02-13 ENCOUNTER — Other Ambulatory Visit: Payer: Self-pay | Admitting: General Surgery

## 2021-02-13 DIAGNOSIS — N401 Enlarged prostate with lower urinary tract symptoms: Secondary | ICD-10-CM

## 2021-02-21 ENCOUNTER — Encounter: Payer: Self-pay | Admitting: *Deleted

## 2021-03-06 ENCOUNTER — Ambulatory Visit: Payer: Medicare Other | Admitting: Urology

## 2021-03-06 ENCOUNTER — Other Ambulatory Visit
Admission: RE | Admit: 2021-03-06 | Discharge: 2021-03-06 | Disposition: A | Discharge status: Home / Self Care | Payer: Medicare Other | Attending: Urology | Admitting: Urology

## 2021-03-06 ENCOUNTER — Other Ambulatory Visit: Payer: Self-pay

## 2021-03-06 VITALS — BP 148/88 | HR 90 | Ht 70.0 in | Wt 180.0 lb

## 2021-03-06 DIAGNOSIS — N401 Enlarged prostate with lower urinary tract symptoms: Secondary | ICD-10-CM

## 2021-03-06 DIAGNOSIS — N138 Other obstructive and reflux uropathy: Secondary | ICD-10-CM | POA: Insufficient documentation

## 2021-03-06 DIAGNOSIS — R102 Pelvic and perineal pain: Secondary | ICD-10-CM | POA: Diagnosis present

## 2021-03-06 DIAGNOSIS — R351 Nocturia: Secondary | ICD-10-CM | POA: Insufficient documentation

## 2021-03-06 LAB — URINALYSIS, COMPLETE (UACMP) WITH MICROSCOPIC
Bacteria, UA: NONE SEEN
Bilirubin Urine: NEGATIVE
Glucose, UA: NEGATIVE mg/dL
Hgb urine dipstick: NEGATIVE
Ketones, ur: NEGATIVE mg/dL
Leukocytes,Ua: NEGATIVE
Nitrite: NEGATIVE
Protein, ur: NEGATIVE mg/dL
Specific Gravity, Urine: >1.03 — ABNORMAL HIGH (ref 1.005–1.030)
pH: 6 (ref 5.0–8.0)

## 2021-03-06 NOTE — Progress Notes (Signed)
° °  03/06/2021 10:34 AM   Lowella Curb Gallery [birth year] [id number]  Reason for visit: Follow up BPH and urinary symptoms, new left testicular pain  HPI: 76 year old male who I have followed for BPH, and previously underwent a gross hematuria work-up that showed a friable prostate, and cystoscopy showed an enlarged prostate with no median lobe and a high bladder neck.  He is on maximal medical therapy with Flomax and finasteride.  I had offered UroLift previously in April 2022, but he decided to hold off and continue medications.  He recently had lower abdominal pain for a few days after playing golf and was worked up with a CT scan by his PCP.  I personally viewed and interpreted those images from 02/12/2021, and prostate measures 47 g with no hydronephrosis or other urologic abnormalities, and a small fat-containing right inguinal hernia.  He reports his lower abdominal pain resolved within a few weeks, and he correlates this with stopping his Celebrex.  Today, he has had a few months of left-sided testicular pain that is worse with physical activity.  On exam, testicles are 20 cc and descended bilaterally without masses, left testicle mildly tender to palpation.  He has been unable to void thus far for a urinalysis and voided prior to arrival.  We will try to get a UA and culture today to rule out epididymitis or UTI, and if not he will come back to the lab later this week to give a urine sample.  Reassurance was provided regarding his left-sided scrotal pain, and we discussed behavioral strategies including snug fitting underwear, icing as needed, or a 1 to 2-week course of NSAIDs like Aleve or ibuprofen.  Regarding his BPH symptoms, he is currently happy with how he is doing at this time and is not interested in outlet procedures at this time.  Continue Flomax and finasteride Trial of NSAIDs for scrotal pain Call with urinalysis and culture results   Billey Co, [place] [address] [place], [place] [address] [phone]

## 2021-03-06 NOTE — Patient Instructions (Signed)
Pelvic Pain, Male Pelvic pain is pain in your lower abdomen, below your belly button and between your hips. The pain may start suddenly (be acute), keep coming back (recur), or last a long time (become chronic). Pelvic pain that lasts longer than six months is considered chronic. There are many possible causes of pelvic pain. Sometimes, the cause is not known. Pelvic pain may affect your: Prostate gland. Urinary system. Digestive tract. Musculoskeletal system. Strained muscles or ligaments may cause pelvic pain. Follow these instructions at home: Medicines Take over-the-counter and prescription medicines only as told by your health care provider. If you were prescribed an antibiotic medicine, take it as told by your health care provider. Do not stop taking the antibiotic even if you start to feel better. Managing pain, stiffness, and swelling  Take warm water baths (sitz baths). Sitz baths help with relaxing your pelvic floor muscles. For a sitz bath, the water only comes up to your hips and covers your buttocks. A sitz bath may done at home in a bathtub or with a portable sitz bath that fits over the toilet. If directed, apply heat to the affected area before you exercise. Use the heat source that your health care provider recommends, such as a moist heat pack or a heating pad. Place a towel between your skin and the heat source. Leave the heat on for 20-30 minutes. Remove the heat if your skin turns bright red. This is especially important if you are unable to feel pain, heat, or cold. You may have a greater risk of getting burned. General instructions Rest as told by your health care provider. Keep a journal of your pelvic pain. Write down: When the pain started. Where the pain is located. What seems to make the pain better or worse. Any symptoms you have along with the pain. Follow your treatment plan as told by your health care provider. This may include: Pelvic physical  therapy. Yoga, meditation, and exercise. Biofeedback. This process trains you to manage your body's response (physiological response) through breathing techniques and relaxation methods. You will work with a therapist while machines are used to monitor your physical symptoms. Acupuncture. This is a type of treatment that involves stimulating specific points on your body by inserting thin needles through your skin to treat pain. Keep all follow-up visits as told by your health care provider. This is important. Contact a health care provider if: Medicine does not help your pain. Your pain comes back. You have new symptoms. You have a fever or chills. You are constipated. You have blood in your urine or stool. You feel weak or light-headed. Get help right away if: You have sudden severe pain. Your pain steadily gets worse. You have severe pain along with fever, nausea, vomiting, or excessive sweating. Summary Pelvic pain is pain in your lower abdomen, below your belly button and between your hips. There are many possible causes of pelvic pain. Sometimes, the cause is not known. Take over-the-counter and prescription medicines only as told by your health care provider. If you were prescribed an antibiotic medicine, take it as told by your health care provider. Do not stop taking the antibiotic even if you start to feel better. Contact a health care provider if you have new or worsening symptoms. Get help right away if you have severe pain along with fever, nausea, vomiting, or excessive sweating. Keep all follow-up visits as told by your health care provider. This is important. This information is not intended to replace  advice given to you by your health care provider. Make sure you discuss any questions you have with your health care provider. Document Revised: 07/09/2017 Document Reviewed: 07/09/2017 Elsevier Patient Education  [place].

## 2021-03-06 NOTE — Addendum Note (Signed)
Addended by: Donalee Citrin on: 03/06/2021 10:41 AM   Modules accepted: Orders

## 2021-03-08 ENCOUNTER — Other Ambulatory Visit: Payer: Self-pay | Admitting: General Surgery

## 2021-03-08 LAB — URINE CULTURE: Culture: NO GROWTH

## 2021-03-08 NOTE — Progress Notes (Signed)
Subjective:     Patient ID: Ryan Mejia is a 76 y.o. male.   HPI   The following portions of the patient's history were reviewed and updated as appropriate.   This a new patient is here today for: office visit. Patient was referred from Dr. Doy Hutching for evaluation of a right inguinal hernia. He did have a CT scan completed on 02-12-21 at [facility].  He states he noticed it after playing golf about 2 weeks ago, but no specific injury.  He was able to finish the round, in fact going to [employer] after the onset of pain.  It was fairly significant for the first 48 hours and then is gradually resolved to a dull discomfort.  Pain was a 5-08/2008 when it was at its worst.   The patient reports that the lower abdominal pain, extending from right to left lower quadrant gives him the sensation as if he needs to void urgently but is in a place where this would not be possible.  Urination does not relieve his symptoms.  He reports a sense that he does not empty completely.  Bladder scan in April 2022 showed a postvoid residual of 34 cc.   Activity is not significantly improved or worsened his symptoms.   He reports that at baseline he gets up at least twice a night.  Since the onset of his symptoms he has been getting up anywhere between 3-5 times per night.  He still stands to urinate, reporting of markedly decreased stream.  He reports when he sits he is unable to void.   He was evaluated by Nickolas Madrid, MD from urology in February 2021 for evaluation of benign prostatic hypertrophy.   Since diagnosis of his hernia he had been encouraged to avoid any activity including golf.       He is here with his wife, [name].      Chief Complaint  Patient presents with   Inguinal Hernia      BP 124/54    Pulse 66    Temp 36.3 C (97.3 F)    Ht 175.3 cm (5' 9.02")    Wt 82.6 kg (182 lb)    SpO2 96%    BMI 26.86 kg/m        Past Medical History:  Diagnosis Date   Adrenal nodules bilateral      on CT scan  03/2018   Aortic atherosclerosis (CMS-HCC)      on CT scan 03/2018   BPH (benign prostatic hyperplasia)     Cataract cortical, senile      Bilateral   Chickenpox     Chronic prostatitis     Colon polyp     Diverticulosis 04/18/2016   HLD (hyperlipidemia)     HTN (hypertension)     Hyperplastic colon polyp 04/18/2016   Labyrinthitis     Multinodular goiter (nontoxic)      s/p ultrasound guided FNAB of 1.9 cm and 1.5 cm left thyroid nodules in 12/10.  Both were benign.   Nocturia     Recurrent herpes simplex     Skin cancer, basal cell      Followed by Dr. Sharlett Iles           Past Surgical History:  Procedure Laterality Date   CATARACT EXTRACTION       COLONOSCOPY   04/18/2016    Hyperplastic colon polyp/PHx colon polyp/Repeat 69yr/MUS   FNA biopsy thyroid - benign       TONSILLECTOMY  Social History           Socioeconomic History   Marital status: Married  Tobacco Use   Smoking status: Former      Packs/day: 0.33      Types: Cigarettes      Quit date: 03/08/2019      Years since quitting: 1.9   Smokeless tobacco: Never  Vaping Use   Vaping Use: Never used  Substance and Sexual Activity   Alcohol use: Yes      Alcohol/week: 2.0 standard drinks      Types: 2 Cans of beer per week      Comment: SOCIALLY   Drug use: No   Sexual activity: Defer            Allergies  Allergen Reactions   Lipitor [Atorvastatin] Muscle Pain      Current Medications        Current Outpatient Medications  Medication Sig Dispense Refill   acyclovir (ZOVIRAX) 400 MG tablet Take 1 tablet (400 mg total) by mouth 2 (two) times daily 180 tablet 3   celecoxib (CELEBREX) 200 MG capsule Take 1 capsule (200 mg total) by mouth once daily 90 capsule 3   finasteride (PROSCAR) 5 mg tablet Take 1 tablet (5 mg total) by mouth once daily 90 tablet 3   ketoconazole (NIZORAL) 2 % shampoo     3   Lacto.acidophilus-Bif.animalis 32 billion cell Cap Take by mouth       rosuvastatin  (CRESTOR) 5 MG tablet Take 1 tablet (5 mg total) by mouth once daily 90 tablet 3   tamsulosin (FLOMAX) 0.4 mg capsule Take 1 capsule (0.4 mg total) by mouth once daily TAKE 1/2 HOUR AFTER SAME MEAL EVERY DAY 90 capsule 3    No current facility-administered medications for this visit.             Family History  Problem Relation Age of Onset   Breast cancer Mother     Prostate cancer Father     Colon cancer Neg Hx     Rectal cancer Neg Hx     Colon polyps Neg Hx          Labs and Radiology:    Radiology review:   CT of the abdomen and pelvis dated February 12, 2021 was independently reviewed.  Right fat-containing inguinal hernia.  Large prostate with calcifications near the base.  Stable adrenal adenomas.     Laboratory review December 19, 2020: Glucose 70 - 110 mg/dL 93   Sodium 136 - 145 mmol/L 140   Potassium 3.6 - 5.1 mmol/L 4.4   Chloride 97 - 109 mmol/L 106   Carbon Dioxide (CO2) 22.0 - 32.0 mmol/L 28.6   Urea Nitrogen (BUN) 7 - 25 mg/dL 18   Creatinine 0.7 - 1.3 mg/dL 0.9   Glomerular Filtration Rate (eGFR), MDRD Estimate >60 mL/min/1.73sq m 82   Calcium 8.7 - 10.3 mg/dL 9.0   AST  8 - 39 U/L 19   ALT  6 - 57 U/L 14   Alk Phos (alkaline Phosphatase) 34 - 104 U/L 71   Albumin 3.5 - 4.8 g/dL 4.4   Bilirubin, Total 0.3 - 1.2 mg/dL 0.9   Protein, Total 6.1 - 7.9 g/dL 6.0 Low    A/G Ratio 1.0 - 5.0 gm/dL 2.8     WBC (White Blood Cell Count) 4.1 - 10.2 103/uL 4.2   RBC (Red Blood Cell Count) 4.69 - 6.13 106/uL 4.12 Low    Hemoglobin 14.1 -  18.1 gm/dL 13.2 Low    Hematocrit 40.0 - 52.0 % 40.1   MCV (Mean Corpuscular Volume) 80.0 - 100.0 fl 97.3   MCH (Mean Corpuscular Hemoglobin) 27.0 - 31.2 pg 32.0 High    MCHC (Mean Corpuscular Hemoglobin Concentration) 32.0 - 36.0 gm/dL 32.9   Platelet Count 150 - 450 103/uL 206   RDW-CV (Red Cell Distribution Width) 11.6 - 14.8 % 12.9   MPV (Mean Platelet Volume) 9.4 - 12.4 fl 10.0   Neutrophils 1.50 - 7.80 103/uL 1.87    Lymphocytes 1.00 - 3.60 103/uL 1.55   Monocytes 0.00 - 1.50 103/uL 0.46   Eosinophils 0.00 - 0.55 103/uL 0.21   Basophils 0.00 - 0.09 103/uL 0.05   Neutrophil % 32.0 - 70.0 % 45.1   Lymphocyte % 10.0 - 50.0 % 37.3   Monocyte % 4.0 - 13.0 % 11.1   Eosinophil % 1.0 - 5.0 % 5.1 High    Basophil% 0.0 - 2.0 % 1.2   Immature Granulocyte % <=0.7 % 0.2   Immature Granulocyte Count <=0.06 10^3/L 0.01     Serial hemoglobin exams dating back to 2019:    Hemoglobin 14.1 - 18.1 gm/dL 13.2 Low   13.1 Low   13.3 Low   13.4 Low   13.7 Low   13.7 Low   13.1 Low        January 2022 through November and 15 PSA:    PSA (Prostate Specific Antigen), Total 0.10 - 4.00 ng/mL 1.39  1.27  1.25  1.79  2.53  2.00  1.40         Colonoscopy 2018 completed by Ruby Cola, MD:   Multiple small polyps. A. RECTUM POLYP; COLD BIOPSY:  - HYPERPLASTIC POLYP.  - NEGATIVE FOR DYSPLASIA AND MALIGNANCY.   B. COLON POLYP, PROXIMAL SIGMOID, COLD SNARE:  - HYPERPLASTIC POLYP.  - NEGATIVE FOR DYSPLASIA AND MALIGNANCY.   C. COLON POLYP, SPLENIC FLEXURE; COLD SNARE:  - HYPERPLASTIC POLYP.  - NEGATIVE FOR DYSPLASIA AND MALIGNANCY.   D. COLON POLYP, CECUM; COLD BIOPSY:  - HYPERPLASTIC POLYP.  - NEGATIVE FOR DYSPLASIA AND MALIGNANCY.      Review of Systems  Constitutional: Negative for chills and fever.  Respiratory: Negative for cough.          Objective:   Physical Exam Constitutional:      Appearance: Normal appearance.  Cardiovascular:     Rate and Rhythm: Normal rate and regular rhythm.     Pulses: Normal pulses.     Heart sounds: Normal heart sounds.  Pulmonary:     Effort: Pulmonary effort is normal.     Breath sounds: Normal breath sounds.  Abdominal:     General: Abdomen is flat. Bowel sounds are normal.     Palpations: Abdomen is soft.     Tenderness: There is no abdominal tenderness.     Hernia: A hernia is present. There is no hernia in the left inguinal area. Right inguinal:  reducible in standing position. Non-tender.   Genitourinary:    Testes: Normal.       Comments: Broad-based area across the mid lower abdomen 8 cm above the base of the penis where the patient experiences severe pain 2 weeks ago.  The area of the inguinal hernia about 5 cm below this is nontender, even during hernia reduction. Musculoskeletal:     Cervical back: Neck supple.  Neurological:     Mental Status: He is alert and oriented to person, place, and time.  Psychiatric:  Mood and Affect: Mood normal.        Behavior: Behavior normal.           Assessment:     Right inguinal hernia, reducible, nontender.   Increasing symptoms of nocturia and incomplete emptying.    Plan:     Indications for elective hernia repair were reviewed.  In light of his change in voiding symptoms I suggested evaluation by urology prior to surgical repair.   Role of prosthetic mesh and hernia repair reviewed.  Opportunity for referral to physicians to do laparoscopic/robotic surgery if desired, declined at this time.   Follow-up will be based on upcoming evaluation with Dr.Sninsky.     This note is partially prepared by Ledell Noss, CMA acting as a scribe in the presence of Dr. Hervey Ard, MD.  This note is partially prepared by Karie Fetch, RN, acting as a scribe in the presence of Dr. Hervey Ard, MD.    The documentation recorded by the scribe accurately reflects the service I personally performed and the decisions made by me.    Robert Bellow, MD FACS  The patient was evaluated by Nickolas Madrid, MD from the urology service on March 06, 2021.  At that time the patient's main complaint was left testicular pain.  Urinalysis was remarkable only for an elevated specific gravity.  Urine culture was negative.  We will proceed with right inguinal hernia repair.

## 2021-03-12 ENCOUNTER — Other Ambulatory Visit: Payer: Self-pay

## 2021-03-12 ENCOUNTER — Encounter
Admission: RE | Admit: 2021-03-12 | Discharge: 2021-03-12 | Disposition: A | Discharge status: Home / Self Care | Payer: Medicare Other | Source: Ambulatory Visit | Attending: General Surgery | Admitting: General Surgery

## 2021-03-12 HISTORY — DX: Chronic obstructive pulmonary disease, unspecified: J44.9

## 2021-03-12 NOTE — Patient Instructions (Signed)
Your procedure is scheduled on: 03/19/21 Report to [facility]. To find out your arrival time please call [phone] between 1PM - 3PM on 03/16/21.  Remember: Instructions that are not followed completely may result in serious medical risk, up to and including death, or upon the discretion of your surgeon and anesthesiologist your surgery may need to be rescheduled.     _X__ 1. Do not eat food after midnight the night before your procedure.                 No gum chewing or hard candies. You may drink clear liquids up to 2 hours                 before you are scheduled to arrive for your surgery- DO not drink clear                 liquids within 2 hours of the start of your surgery.                 Clear Liquids include:  water, apple juice without pulp, clear carbohydrate                 drink such as Clearfast or Gatorade, Black Coffee or Tea (Do not add                 anything to coffee or tea). Diabetics water only  __X__2.  On the morning of surgery brush your teeth with toothpaste and water, you                 may rinse your mouth with mouthwash if you wish.  Do not swallow any              toothpaste of mouthwash.     _X__ 3.  No Alcohol for 24 hours before or after surgery.   _X__ 4.  Do Not Smoke or use e-cigarettes For 24 Hours Prior to Your Surgery.                 Do not use any chewable tobacco products for at least 6 hours prior to                 surgery.  ____  5.  Bring all medications with you on the day of surgery if instructed.   __X__  6.  Notify your doctor if there is any change in your medical condition      (cold, fever, infections).     Do not wear jewelry, make-up, hairpins, clips or nail polish. Do not wear lotions, powders, or perfumes.  Do not shave body hair 48 hours prior to surgery. Men may shave face and neck. Do not bring valuables to the hospital.    [facility] is not responsible for any  belongings or valuables.  Contacts, dentures/partials or body piercings may not be worn into surgery. Bring a case for your contacts, glasses or hearing aids, a denture cup will be supplied. Leave your suitcase in the car. After surgery it may be brought to your room. For patients admitted to the hospital, discharge time is determined by your treatment team.   Patients discharged the day of surgery will not be allowed to drive home.   Please read over the following fact sheets that you were given:   CHG soap  __X__ Take these medicines the morning of surgery with A SIP OF WATER:  1. acyclovir (ZOVIRAX) 400 MG tablet  2.   3.   4.  5.  6.  ____ Fleet Enema (as directed)   __X__ Use CHG Soap/SAGE wipes as directed  ____ Use inhalers on the day of surgery  ____ Stop metformin/Janumet/Farxiga 2 days prior to surgery    ____ Take 1/2 of usual insulin dose the night before surgery. No insulin the morning          of surgery.   ____ Stop Blood Thinners Coumadin/Plavix/Xarelto/Pleta/Pradaxa/Eliquis/Effient/Aspirin  on   Or contact your Surgeon, Cardiologist or Medical Doctor regarding  ability to stop your blood thinners  __X__ Stop Anti-inflammatories 7 days before surgery such as Advil, Ibuprofen, Motrin,  BC or Goodies Powder, Naprosyn, Naproxen, Aleve, Aspirin    __X__ Stop all herbals and supplements, fish oil or vitamins  until after surgery.    ____ Bring C-Pap to the hospital.    You may use Tylenol if needed.

## 2021-03-13 ENCOUNTER — Other Ambulatory Visit
Admission: RE | Admit: 2021-03-13 | Discharge: 2021-03-13 | Disposition: A | Discharge status: Home / Self Care | Payer: Medicare Other | Source: Ambulatory Visit | Attending: General Surgery | Admitting: General Surgery

## 2021-03-13 DIAGNOSIS — I10 Essential (primary) hypertension: Secondary | ICD-10-CM | POA: Insufficient documentation

## 2021-03-13 DIAGNOSIS — Z01810 Encounter for preprocedural cardiovascular examination: Secondary | ICD-10-CM | POA: Insufficient documentation

## 2021-03-19 ENCOUNTER — Ambulatory Visit: Admission: RE | Admit: 2021-03-19 | Payer: Medicare Other | Source: Home / Self Care | Admitting: General Surgery

## 2021-03-19 ENCOUNTER — Encounter: Admission: RE | Payer: Self-pay | Source: Home / Self Care

## 2021-03-19 SURGERY — REPAIR, HERNIA, INGUINAL, ADULT
Anesthesia: General | Laterality: Right

## 2021-04-12 ENCOUNTER — Other Ambulatory Visit: Payer: Self-pay | Admitting: General Surgery

## 2021-04-12 NOTE — Progress Notes (Signed)
Subjective:     Patient ID: Ryan Mejia is a 76 y.o. male.   HPI   The following portions of the patient's history were reviewed and updated as appropriate.   This a new patient is here today for: office visit. Patient was referred from Dr. Doy Hutching for evaluation of a right inguinal hernia. He did have a CT scan completed on 02-12-21 at [facility].  He states he noticed it after playing golf about 2 weeks ago, but no specific injury.  He was able to finish the round, in fact going to [employer] after the onset of pain.  It was fairly significant for the first 48 hours and then is gradually resolved to a dull discomfort.  Pain was a 5-08/2008 when it was at its worst.   The patient reports that the lower abdominal pain, extending from right to left lower quadrant gives him the sensation as if he needs to void urgently but is in a place where this would not be possible.  Urination does not relieve his symptoms.  He reports a sense that he does not empty completely.  Bladder scan in April 2022 showed a postvoid residual of 34 cc.   Activity is not significantly improved or worsened his symptoms.   He reports that at baseline he gets up at least twice a night.  Since the onset of his symptoms he has been getting up anywhere between 3-5 times per night.  He still stands to urinate, reporting of markedly decreased stream.  He reports when he sits he is unable to void.   He was evaluated by Nickolas Madrid, MD from urology in February 2021 for evaluation of benign prostatic hypertrophy.   Since diagnosis of his hernia he had been encouraged to avoid any activity including golf.       He is here with his wife, Ryan Mejia.      Chief Complaint  Patient presents with   Inguinal Hernia      BP 124/54    Pulse 66    Temp 36.3 C (97.3 F)    Ht 175.3 cm (5' 9.02")    Wt 82.6 kg (182 lb)    SpO2 96%    BMI 26.86 kg/m        Past Medical History:  Diagnosis Date   Adrenal nodules bilateral      on CT scan  03/2018   Aortic atherosclerosis (CMS-HCC)      on CT scan 03/2018   BPH (benign prostatic hyperplasia)     Cataract cortical, senile      Bilateral   Chickenpox     Chronic prostatitis     Colon polyp     Diverticulosis 04/18/2016   HLD (hyperlipidemia)     HTN (hypertension)     Hyperplastic colon polyp 04/18/2016   Labyrinthitis     Multinodular goiter (nontoxic)      s/p ultrasound guided FNAB of 1.9 cm and 1.5 cm left thyroid nodules in 12/10.  Both were benign.   Nocturia     Recurrent herpes simplex     Skin cancer, basal cell      Followed by Dr. Sharlett Iles           Past Surgical History:  Procedure Laterality Date   CATARACT EXTRACTION       COLONOSCOPY   04/18/2016    Hyperplastic colon polyp/PHx colon polyp/Repeat 69yr/MUS   FNA biopsy thyroid - benign       TONSILLECTOMY  Social History           Socioeconomic History   Marital status: Married  Tobacco Use   Smoking status: Former      Packs/day: 0.33      Types: Cigarettes      Quit date: 03/08/2019      Years since quitting: 1.9   Smokeless tobacco: Never  Vaping Use   Vaping Use: Never used  Substance and Sexual Activity   Alcohol use: Yes      Alcohol/week: 2.0 standard drinks      Types: 2 Cans of beer per week      Comment: SOCIALLY   Drug use: No   Sexual activity: Defer            Allergies  Allergen Reactions   Lipitor [Atorvastatin] Muscle Pain      Current Medications        Current Outpatient Medications  Medication Sig Dispense Refill   acyclovir (ZOVIRAX) 400 MG tablet Take 1 tablet (400 mg total) by mouth 2 (two) times daily 180 tablet 3   celecoxib (CELEBREX) 200 MG capsule Take 1 capsule (200 mg total) by mouth once daily 90 capsule 3   finasteride (PROSCAR) 5 mg tablet Take 1 tablet (5 mg total) by mouth once daily 90 tablet 3   ketoconazole (NIZORAL) 2 % shampoo     3   Lacto.acidophilus-Bif.animalis 32 billion cell Cap Take by mouth       rosuvastatin  (CRESTOR) 5 MG tablet Take 1 tablet (5 mg total) by mouth once daily 90 tablet 3   tamsulosin (FLOMAX) 0.4 mg capsule Take 1 capsule (0.4 mg total) by mouth once daily TAKE 1/2 HOUR AFTER SAME MEAL EVERY DAY 90 capsule 3    No current facility-administered medications for this visit.             Family History  Problem Relation Age of Onset   Breast cancer Mother     Prostate cancer Father     Colon cancer Neg Hx     Rectal cancer Neg Hx     Colon polyps Neg Hx          Labs and Radiology:    Radiology review:   CT of the abdomen and pelvis dated February 12, 2021 was independently reviewed.  Right fat-containing inguinal hernia.  Large prostate with calcifications near the base.  Stable adrenal adenomas.     Laboratory review December 19, 2020: Glucose 70 - 110 mg/dL 93   Sodium 136 - 145 mmol/L 140   Potassium 3.6 - 5.1 mmol/L 4.4   Chloride 97 - 109 mmol/L 106   Carbon Dioxide (CO2) 22.0 - 32.0 mmol/L 28.6   Urea Nitrogen (BUN) 7 - 25 mg/dL 18   Creatinine 0.7 - 1.3 mg/dL 0.9   Glomerular Filtration Rate (eGFR), MDRD Estimate >60 mL/min/1.73sq m 82   Calcium 8.7 - 10.3 mg/dL 9.0   AST  8 - 39 U/L 19   ALT  6 - 57 U/L 14   Alk Phos (alkaline Phosphatase) 34 - 104 U/L 71   Albumin 3.5 - 4.8 g/dL 4.4   Bilirubin, Total 0.3 - 1.2 mg/dL 0.9   Protein, Total 6.1 - 7.9 g/dL 6.0 Low    A/G Ratio 1.0 - 5.0 gm/dL 2.8     WBC (White Blood Cell Count) 4.1 - 10.2 103/uL 4.2   RBC (Red Blood Cell Count) 4.69 - 6.13 106/uL 4.12 Low    Hemoglobin 14.1 -  18.1 gm/dL 13.2 Low    Hematocrit 40.0 - 52.0 % 40.1   MCV (Mean Corpuscular Volume) 80.0 - 100.0 fl 97.3   MCH (Mean Corpuscular Hemoglobin) 27.0 - 31.2 pg 32.0 High    MCHC (Mean Corpuscular Hemoglobin Concentration) 32.0 - 36.0 gm/dL 32.9   Platelet Count 150 - 450 103/uL 206   RDW-CV (Red Cell Distribution Width) 11.6 - 14.8 % 12.9   MPV (Mean Platelet Volume) 9.4 - 12.4 fl 10.0   Neutrophils 1.50 - 7.80 103/uL 1.87    Lymphocytes 1.00 - 3.60 103/uL 1.55   Monocytes 0.00 - 1.50 103/uL 0.46   Eosinophils 0.00 - 0.55 103/uL 0.21   Basophils 0.00 - 0.09 103/uL 0.05   Neutrophil % 32.0 - 70.0 % 45.1   Lymphocyte % 10.0 - 50.0 % 37.3   Monocyte % 4.0 - 13.0 % 11.1   Eosinophil % 1.0 - 5.0 % 5.1 High    Basophil% 0.0 - 2.0 % 1.2   Immature Granulocyte % <=0.7 % 0.2   Immature Granulocyte Count <=0.06 10^3/L 0.01     Serial hemoglobin exams dating back to 2019:    Hemoglobin 14.1 - 18.1 gm/dL 13.2 Low   13.1 Low   13.3 Low   13.4 Low   13.7 Low   13.7 Low   13.1 Low        January 2022 through November and 15 PSA:    PSA (Prostate Specific Antigen), Total 0.10 - 4.00 ng/mL 1.39  1.27  1.25  1.79  2.53  2.00  1.40         Colonoscopy 2018 completed by Ruby Cola, MD:   Multiple small polyps. A. RECTUM POLYP; COLD BIOPSY:  - HYPERPLASTIC POLYP.  - NEGATIVE FOR DYSPLASIA AND MALIGNANCY.   B. COLON POLYP, PROXIMAL SIGMOID, COLD SNARE:  - HYPERPLASTIC POLYP.  - NEGATIVE FOR DYSPLASIA AND MALIGNANCY.   C. COLON POLYP, SPLENIC FLEXURE; COLD SNARE:  - HYPERPLASTIC POLYP.  - NEGATIVE FOR DYSPLASIA AND MALIGNANCY.   D. COLON POLYP, CECUM; COLD BIOPSY:  - HYPERPLASTIC POLYP.  - NEGATIVE FOR DYSPLASIA AND MALIGNANCY.      Review of Systems  Constitutional: Negative for chills and fever.  Respiratory: Negative for cough.          Objective:   Physical Exam Constitutional:      Appearance: Normal appearance.  Cardiovascular:     Rate and Rhythm: Normal rate and regular rhythm.     Pulses: Normal pulses.     Heart sounds: Normal heart sounds.  Pulmonary:     Effort: Pulmonary effort is normal.     Breath sounds: Normal breath sounds.  Abdominal:     General: Abdomen is flat. Bowel sounds are normal.     Palpations: Abdomen is soft.     Tenderness: There is no abdominal tenderness.     Hernia: A hernia is present. There is no hernia in the left inguinal area. Right inguinal:  reducible in standing position. Non-tender.   Genitourinary:    Testes: Normal.       Comments: Broad-based area across the mid lower abdomen 8 cm above the base of the penis where the patient experiences severe pain 2 weeks ago.  The area of the inguinal hernia about 5 cm below this is nontender, even during hernia reduction. Musculoskeletal:     Cervical back: Neck supple.  Neurological:     Mental Status: He is alert and oriented to person, place, and time.  Psychiatric:  Mood and Affect: Mood normal.        Behavior: Behavior normal.           Assessment:     Right inguinal hernia, reducible, nontender.   Increasing symptoms of nocturia and incomplete emptying.    Plan:     Indications for elective hernia repair were reviewed.  In light of his change in voiding symptoms I suggested evaluation by urology prior to surgical repair.   Role of prosthetic mesh and hernia repair reviewed.  Opportunity for referral to physicians to do laparoscopic/robotic surgery if desired, declined at this time.   Follow-up will be based on upcoming evaluation with Dr.Sninsky.     This note is partially prepared by Ledell Noss, CMA acting as a scribe in the presence of Dr. Hervey Ard, MD.  This note is partially prepared by Karie Fetch, RN, acting as a scribe in the presence of Dr. Hervey Ard, MD.    The documentation recorded by the scribe accurately reflects the service I personally performed and the decisions made by me.    Robert Bellow, MD FACS

## 2021-04-30 ENCOUNTER — Other Ambulatory Visit: Payer: Self-pay | Admitting: *Deleted

## 2021-04-30 DIAGNOSIS — Z87891 Personal history of nicotine dependence: Secondary | ICD-10-CM

## 2021-05-02 ENCOUNTER — Encounter
Admission: RE | Admit: 2021-05-02 | Discharge: 2021-05-02 | Disposition: A | Discharge status: Home / Self Care | Payer: Medicare Other | Source: Ambulatory Visit | Attending: General Surgery | Admitting: General Surgery

## 2021-05-02 ENCOUNTER — Other Ambulatory Visit: Payer: Self-pay

## 2021-05-02 HISTORY — DX: Personal history of other specified conditions: Z87.898

## 2021-05-02 HISTORY — DX: Atherosclerosis of aorta: I70.0

## 2021-05-02 NOTE — Patient Instructions (Signed)
Your procedure is scheduled on:05-09-21 Wednesday ?Report to the Registration Desk on the 1st floor of the [facility].Then proceed to the 2nd floor Surgery Desk in the [facility] ?To find out your arrival time, please call [phone] between 1PM - 3PM on:05-08-21 Tuesday ? ?REMEMBER: ?Instructions that are not followed completely may result in serious medical risk, up to and including death; or upon the discretion of your surgeon and anesthesiologist your surgery may need to be rescheduled. ? ?Do not eat food after midnight the night before surgery.  ?No gum chewing, lozengers or hard candies. ? ?You may however, drink CLEAR liquids up to 2 hours before you are scheduled to arrive for your surgery. Do not drink anything within 2 hours of your scheduled arrival time. ? ?Clear liquids include: ?- water  ?- apple juice without pulp ?- gatorade (not RED colors) ?- black coffee or tea (Do NOT add milk or creamers to the coffee or tea) ?Do NOT drink anything that is not on this list. ? ?TAKE THESE MEDICATIONS THE MORNING OF SURGERY WITH A SIP OF WATER: ?-acyclovir (ZOVIRAX) ? ?One week prior to surgery: ?Stop Anti-inflammatories (NSAIDS) such as Advil, Aleve, Ibuprofen, Motrin, Naproxen, Naprosyn and Aspirin based products such as Excedrin, Goodys Powder, BC Powder.You may however, take Tylenol if needed for pain up until the day of surgery. ? ?Stop ANY OVER THE COUNTER supplements/vitamins NOW (05-02-21) until after surgery ? ?No Alcohol for 24 hours before or after surgery. ? ?No Smoking including e-cigarettes for 24 hours prior to surgery.  ?No chewable tobacco products for at least 6 hours prior to surgery.  ?No nicotine patches on the day of surgery. ? ?Do not use any "recreational" drugs for at least a week prior to your surgery.  ?Please be advised that the combination of cocaine and anesthesia may have negative outcomes, up to and including death. ?If you test positive for cocaine, your surgery will be  cancelled. ? ?On the morning of surgery brush your teeth with toothpaste and water, you may rinse your mouth with mouthwash if you wish. ?Do not swallow any toothpaste or mouthwash. ? ?Use CHG Soap as directed on instruction sheet. ? ?Do not wear jewelry, make-up, hairpins, clips or nail polish. ? ?Do not wear lotions, powders, or perfumes.  ? ?Do not shave body from the neck down 48 hours prior to surgery just in case you cut yourself which could leave a site for infection.  ?Also, freshly shaved skin may become irritated if using the CHG soap. ? ?Contact lenses, hearing aids and dentures may not be worn into surgery. ? ?Do not bring valuables to the hospital. [facility] is not responsible for any missing/lost belongings or valuables.  ? ?Notify your doctor if there is any change in your medical condition (cold, fever, infection). ? ?Wear comfortable clothing (specific to your surgery type) to the hospital. ? ?After surgery, you can help prevent lung complications by doing breathing exercises.  ?Take deep breaths and cough every 1-2 hours. Your doctor may order a device called an Incentive Spirometer to help you take deep breaths. ?When coughing or sneezing, hold a pillow firmly against your incision with both hands. This is called ?splinting.? Doing this helps protect your incision. It also decreases belly discomfort. ? ?If you are being admitted to the hospital overnight, leave your suitcase in the car. ?After surgery it may be brought to your room. ? ?If you are being discharged the day of surgery, you will not  be allowed to drive home. ?You will need a responsible adult (18 years or older) to drive you home and stay with you that night.  ? ?If you are taking public transportation, you will need to have a responsible adult (18 years or older) with you. ?Please confirm with your physician that it is acceptable to use public transportation.  ? ?Please call the [facility] at [phone] if you  have any questions about these instructions. ? ?Surgery Visitation Policy: ? ?Patients undergoing a surgery or procedure may have one family member or support person with them as long as that person is not COVID-19 positive or experiencing its symptoms.  ?That person may remain in the waiting area during the procedure and may rotate out with other people. ?

## 2021-05-09 ENCOUNTER — Ambulatory Visit: Payer: Medicare Other | Admitting: Anesthesiology

## 2021-05-09 ENCOUNTER — Encounter: Payer: Self-pay | Admitting: General Surgery

## 2021-05-09 ENCOUNTER — Other Ambulatory Visit: Payer: Self-pay

## 2021-05-09 ENCOUNTER — Encounter
Admission: RE | Disposition: A | Discharge status: Home / Self Care | Payer: Self-pay | Source: Home / Self Care | Attending: General Surgery

## 2021-05-09 ENCOUNTER — Ambulatory Visit
Admission: RE | Admit: 2021-05-09 | Discharge: 2021-05-09 | Disposition: A | Discharge status: Home / Self Care | Payer: Medicare Other | Attending: General Surgery | Admitting: General Surgery

## 2021-05-09 DIAGNOSIS — E785 Hyperlipidemia, unspecified: Secondary | ICD-10-CM | POA: Diagnosis not present

## 2021-05-09 DIAGNOSIS — K4090 Unilateral inguinal hernia, without obstruction or gangrene, not specified as recurrent: Secondary | ICD-10-CM | POA: Diagnosis present

## 2021-05-09 DIAGNOSIS — N400 Enlarged prostate without lower urinary tract symptoms: Secondary | ICD-10-CM | POA: Diagnosis not present

## 2021-05-09 DIAGNOSIS — I700 Atherosclerosis of aorta: Secondary | ICD-10-CM | POA: Diagnosis not present

## 2021-05-09 DIAGNOSIS — I10 Essential (primary) hypertension: Secondary | ICD-10-CM | POA: Insufficient documentation

## 2021-05-09 DIAGNOSIS — Z85828 Personal history of other malignant neoplasm of skin: Secondary | ICD-10-CM | POA: Diagnosis not present

## 2021-05-09 DIAGNOSIS — J449 Chronic obstructive pulmonary disease, unspecified: Secondary | ICD-10-CM | POA: Diagnosis not present

## 2021-05-09 HISTORY — PX: INSERTION OF MESH: SHX5868

## 2021-05-09 HISTORY — PX: INGUINAL HERNIA REPAIR: SHX194

## 2021-05-09 SURGERY — REPAIR, HERNIA, INGUINAL, ADULT
Anesthesia: General | Laterality: Right

## 2021-05-09 MED ORDER — CHLORHEXIDINE GLUCONATE 0.12 % MT SOLN
OROMUCOSAL | Status: AC
  Administered 2021-05-09: 15 mL via OROMUCOSAL
  Filled 2021-05-09: qty 15

## 2021-05-09 MED ORDER — FENTANYL CITRATE (PF) 100 MCG/2ML IJ SOLN
INTRAMUSCULAR | Status: DC | PRN
  Administered 2021-05-09: 50 ug via INTRAVENOUS
  Administered 2021-05-09 (×2): 25 ug via INTRAVENOUS

## 2021-05-09 MED ORDER — CHLORHEXIDINE GLUCONATE CLOTH 2 % EX PADS: 6.0000 | MEDICATED_PAD | Freq: Once | CUTANEOUS | Status: DC

## 2021-05-09 MED ORDER — ONDANSETRON HCL 4 MG/2ML IJ SOLN
INTRAMUSCULAR | Status: DC | PRN
  Administered 2021-05-09: 4 mg via INTRAVENOUS

## 2021-05-09 MED ORDER — KETOROLAC TROMETHAMINE 30 MG/ML IJ SOLN
INTRAMUSCULAR | Status: AC
  Filled 2021-05-09: qty 1

## 2021-05-09 MED ORDER — KETOROLAC TROMETHAMINE 30 MG/ML IJ SOLN
INTRAMUSCULAR | Status: DC | PRN
Start: 2021-05-09 — End: 2021-05-09
  Administered 2021-05-09: 30 mg

## 2021-05-09 MED ORDER — LACTATED RINGERS IV SOLN: INTRAVENOUS | Status: DC

## 2021-05-09 MED ORDER — DEXAMETHASONE SODIUM PHOSPHATE 10 MG/ML IJ SOLN
INTRAMUSCULAR | Status: DC | PRN
  Administered 2021-05-09: 10 mg via INTRAVENOUS

## 2021-05-09 MED ORDER — LACTATED RINGERS IV SOLN: INTRAVENOUS | Status: DC | PRN

## 2021-05-09 MED ORDER — FAMOTIDINE 20 MG PO TABS: 20.0000 mg | ORAL_TABLET | Freq: Once | ORAL | Status: AC

## 2021-05-09 MED ORDER — HYDROCODONE-ACETAMINOPHEN 5-325 MG PO TABS: 1.0000 | ORAL_TABLET | ORAL | 0 refills | Status: DC | PRN

## 2021-05-09 MED ORDER — CEFAZOLIN SODIUM-DEXTROSE 2-4 GM/100ML-% IV SOLN
INTRAVENOUS | Status: AC
  Filled 2021-05-09: qty 100

## 2021-05-09 MED ORDER — EPHEDRINE SULFATE (PRESSORS) 50 MG/ML IJ SOLN
INTRAMUSCULAR | Status: DC | PRN
  Administered 2021-05-09: 10 mg via INTRAVENOUS
  Administered 2021-05-09 (×2): 5 mg via INTRAVENOUS

## 2021-05-09 MED ORDER — BUPIVACAINE-EPINEPHRINE (PF) 0.5% -1:200000 IJ SOLN
INTRAMUSCULAR | Status: DC | PRN
  Administered 2021-05-09: 20 mL via PERINEURAL
  Administered 2021-05-09: 10 mL via PERINEURAL

## 2021-05-09 MED ORDER — VASOPRESSIN 20 UNIT/ML IV SOLN
INTRAVENOUS | Status: DC | PRN
  Administered 2021-05-09 (×3): 1 [IU] via INTRAVENOUS

## 2021-05-09 MED ORDER — BUPIVACAINE-EPINEPHRINE (PF) 0.5% -1:200000 IJ SOLN
INTRAMUSCULAR | Status: AC
  Filled 2021-05-09: qty 30

## 2021-05-09 MED ORDER — PHENYLEPHRINE HCL-NACL 20-0.9 MG/250ML-% IV SOLN
INTRAVENOUS | Status: AC
  Filled 2021-05-09: qty 250

## 2021-05-09 MED ORDER — LIDOCAINE HCL (CARDIAC) PF 100 MG/5ML IV SOSY
PREFILLED_SYRINGE | INTRAVENOUS | Status: DC | PRN
  Administered 2021-05-09: 100 mg via INTRAVENOUS

## 2021-05-09 MED ORDER — 0.9 % SODIUM CHLORIDE (POUR BTL) OPTIME
TOPICAL | Status: DC | PRN
  Administered 2021-05-09: 100 mL

## 2021-05-09 MED ORDER — CEFAZOLIN SODIUM-DEXTROSE 2-4 GM/100ML-% IV SOLN
2.0000 g | INTRAVENOUS | Status: AC
  Administered 2021-05-09: 2 g via INTRAVENOUS

## 2021-05-09 MED ORDER — ONDANSETRON HCL 4 MG/2ML IJ SOLN
INTRAMUSCULAR | Status: AC
  Filled 2021-05-09: qty 2

## 2021-05-09 MED ORDER — EPHEDRINE 5 MG/ML INJ
INTRAVENOUS | Status: AC
  Filled 2021-05-09: qty 5

## 2021-05-09 MED ORDER — PHENYLEPHRINE 40 MCG/ML (10ML) SYRINGE FOR IV PUSH (FOR BLOOD PRESSURE SUPPORT)
PREFILLED_SYRINGE | INTRAVENOUS | Status: DC | PRN
  Administered 2021-05-09: 80 ug via INTRAVENOUS
  Administered 2021-05-09: 160 ug via INTRAVENOUS

## 2021-05-09 MED ORDER — ACETAMINOPHEN 10 MG/ML IV SOLN
INTRAVENOUS | Status: AC
  Filled 2021-05-09: qty 100

## 2021-05-09 MED ORDER — FENTANYL CITRATE (PF) 100 MCG/2ML IJ SOLN
INTRAMUSCULAR | Status: AC
  Filled 2021-05-09: qty 2

## 2021-05-09 MED ORDER — PROPOFOL 10 MG/ML IV BOLUS
INTRAVENOUS | Status: DC | PRN
Start: 2021-05-09 — End: 2021-05-09
  Administered 2021-05-09: 140 mg via INTRAVENOUS

## 2021-05-09 MED ORDER — ACETAMINOPHEN 10 MG/ML IV SOLN
INTRAVENOUS | Status: DC | PRN
  Administered 2021-05-09: 1000 mg via INTRAVENOUS

## 2021-05-09 MED ORDER — VASOPRESSIN 20 UNIT/ML IV SOLN
INTRAVENOUS | Status: AC
  Filled 2021-05-09: qty 1

## 2021-05-09 MED ORDER — FAMOTIDINE 20 MG PO TABS
ORAL_TABLET | ORAL | Status: AC
  Administered 2021-05-09: 20 mg via ORAL
  Filled 2021-05-09: qty 1

## 2021-05-09 MED ORDER — ORAL CARE MOUTH RINSE: 15.0000 mL | Freq: Once | OROMUCOSAL | Status: AC

## 2021-05-09 MED ORDER — CHLORHEXIDINE GLUCONATE 0.12 % MT SOLN
15.0000 mL | Freq: Once | OROMUCOSAL | Status: AC
Start: 2021-05-09 — End: 2021-05-09

## 2021-05-09 SURGICAL SUPPLY — 34 items
APL PRP STRL LF DISP 70% ISPRP (MISCELLANEOUS) ×2
BLADE SURG 15 STRL SS SAFETY (BLADE) ×6 IMPLANT
CHLORAPREP W/TINT 26 (MISCELLANEOUS) ×3 IMPLANT
DRAIN PENROSE 12X.25 LTX STRL (MISCELLANEOUS) ×3 IMPLANT
DRAPE LAPAROTOMY 100X77 ABD (DRAPES) ×3 IMPLANT
DRSG TEGADERM 4X4.75 (GAUZE/BANDAGES/DRESSINGS) ×3 IMPLANT
DRSG TELFA 4X3 1S NADH ST (GAUZE/BANDAGES/DRESSINGS) ×3 IMPLANT
ELECT REM PT RETURN 9FT ADLT (ELECTROSURGICAL) ×3
ELECTRODE REM PT RTRN 9FT ADLT (ELECTROSURGICAL) ×2 IMPLANT
GAUZE 4X4 16PLY ~~LOC~~+RFID DBL (SPONGE) ×3 IMPLANT
GLOVE SURG ENC MOIS LTX SZ7.5 (GLOVE) ×3 IMPLANT
GLOVE SURG UNDER LTX SZ8 (GLOVE) ×3 IMPLANT
GOWN STRL REUS W/ TWL LRG LVL3 (GOWN DISPOSABLE) ×4 IMPLANT
GOWN STRL REUS W/TWL LRG LVL3 (GOWN DISPOSABLE) ×6
KIT TURNOVER KIT A (KITS) ×3 IMPLANT
LABEL OR SOLS (LABEL) ×3 IMPLANT
MANIFOLD NEPTUNE II (INSTRUMENTS) ×3 IMPLANT
MESH HERNIA 6X12 ULTRAPRO MED (Mesh General) IMPLANT
MESH HERNIA ULTRAPRO MED (Mesh General) ×1 IMPLANT
NEEDLE HYPO 22GX1.5 SAFETY (NEEDLE) ×6 IMPLANT
PACK BASIN MINOR ARMC (MISCELLANEOUS) ×3 IMPLANT
SPIKE FLUID TRANSFER (MISCELLANEOUS) ×3 IMPLANT
STRIP CLOSURE SKIN 1/2X4 (GAUZE/BANDAGES/DRESSINGS) ×3 IMPLANT
SUT SURGILON 0 BLK (SUTURE) ×3 IMPLANT
SUT VIC AB 2-0 SH 27 (SUTURE) ×3
SUT VIC AB 2-0 SH 27XBRD (SUTURE) ×2 IMPLANT
SUT VIC AB 3-0 54X BRD REEL (SUTURE) ×2 IMPLANT
SUT VIC AB 3-0 BRD 54 (SUTURE) ×3
SUT VIC AB 3-0 SH 27 (SUTURE) ×3
SUT VIC AB 3-0 SH 27X BRD (SUTURE) ×2 IMPLANT
SUT VIC AB 4-0 FS2 27 (SUTURE) ×3 IMPLANT
SWABSTK COMLB BENZOIN TINCTURE (MISCELLANEOUS) ×3 IMPLANT
SYR 10ML LL (SYRINGE) ×3 IMPLANT
WATER STERILE IRR 500ML POUR (IV SOLUTION) ×3 IMPLANT

## 2021-05-09 NOTE — Op Note (Signed)
Preoperative diagnosis: Right inguinal hernia. ? ?Postoperative diagnosis: Same, direct. ? ?Operative procedure: Right inguinal hernia repair with medium Ultra Pro mesh. ? ?Operating surgeon: Hervey Ard, MD. ? ?Anesthesia: General by LMA, Marcaine 0.5% with 1: 200,000 units of epinephrine, 30 cc; Toradol: 30 mg. ? ?Estimated blood loss: 1 cc. ? ?Clinical note: This healthy 76 year old male is developed symptomatic right inguinal hernia.  He was admitted for elective repair.  Hair was removed from the surgical site prior to presentation to the operating theater.  He received Ancef 2 g intravenously prior to surgery.  SCD stockings for DVT prevention. ? ?Operative note: With the patient under adequate general anesthesia the area was cleansed with ChloraPrep and draped.  Field block anesthesia was established.  A 5 cm skin line incision along the anticipated course the inguinal canal was made.  The skin was incised sharply remaining dissection with electrocautery.  The external oblique was opened in the direction of its fibers.  The ilioinguinal nerve was identified.  The iliohypogastric nerve was not seen.  The cord was mobilized and there was no evidence of an indirect defect.  The floor of the inguinal canal was essentially gone from a direct hernia.  The preperitoneal space was cleared and a medium Ultra Pro mesh smoothed into position.  The external component was laid along the floor of the canal.  A lateral slit was made for cord passage and closed with 0 Surgilon sutures.  There is mesh was anchored to the inguinal ligament with interrupted 0 Surgilon sutures.  The medial and superior borders were anchored to the transverse abdominis aponeurosis.  Toradol was placed in the wound.  The external oblique was closed with a running 2-0 Vicryl suture.  Scarpa's fascia was closed with a running 3-0 Vicryl suture.  The skin closed with a running 4-0 Vicryl subcuticular suture.  Benzoin, Steri-Strips, Telfa and  Tegaderm dressing was applied. ? ?The patient tolerated the procedure well and was taken to the PACU in stable condition. ?

## 2021-05-09 NOTE — Discharge Instructions (Signed)

## 2021-05-09 NOTE — H&P (Signed)
Ryan Mejia ?[id number] ?23-Aug-1945 ? ?  ? ?HPI:  Healthy 76 y/o with a symptomatic right inguinal hernia.  For elective repair.  ? ?Medications Prior to Admission  ?Medication Sig Dispense Refill Last Dose  ? acyclovir (ZOVIRAX) 400 MG tablet Take 400 mg by mouth 2 (two) times daily.   05/09/2021  ? fexofenadine (ALLEGRA) 180 MG tablet Take 180 mg by mouth daily. @ 3PM   05/08/2021  ? finasteride (PROSCAR) 5 MG tablet TAKE 1 TABLET BY MOUTH  DAILY (Patient taking differently: 5 mg daily. @ 3PM) 90 tablet 0 05/08/2021  ? hydroxypropyl methylcellulose / hypromellose (ISOPTO TEARS / GONIOVISC) 2.5 % ophthalmic solution Place 1 drop into both eyes 3 (three) times daily as needed for dry eyes.   Past Week  ? Ibuprofen 200 MG CAPS Take 400 mg by mouth every 6 (six) hours as needed (pain).   Past Week  ? rosuvastatin (CRESTOR) 5 MG tablet Take 5 mg by mouth daily. @ 3PM   05/08/2021  ? tamsulosin (FLOMAX) 0.4 MG CAPS capsule Take 0.4 mg by mouth daily. @ 3PM   05/08/2021  ? ?Allergies  ?Allergen Reactions  ? Celebrex [Celecoxib]   ?  Pain in abdomen  ? Lipitor [Atorvastatin]   ?  Muscle aches  ? ?Past Medical History:  ?Diagnosis Date  ? Aortic atherosclerosis ([place])   ? BPH (benign prostatic hyperplasia)   ? Cataracts, bilateral   ? Chronic prostatitis   ? Elevated PSA   ? Goiter   ? Goiter, nodular   ? Herpes simplex   ? History of colonic polyps   ? History of elevated PSA   ? Hyperlipidemia   ? Hypertension   ? Labyrinthitis   ? Nocturia   ? Skin cancer, basal cell   ? ?Past Surgical History:  ?Procedure Laterality Date  ? COLONOSCOPY WITH PROPOFOL N/A 04/18/2016  ? Procedure: COLONOSCOPY WITH PROPOFOL;  Surgeon: Lollie Sails, MD;  [facility] ENDOSCOPY;  Service: Endoscopy;  Laterality: N/A;  ? EYE SURGERY Bilateral   ? cataracts  ? SKIN CANCER DESTRUCTION    ? TONSILLECTOMY    ? child  ? ?Social History  ? ?Socioeconomic History  ? Marital status: Married  ?  Spouse name: Not on file  ? Number of children: Not on file   ? Years of education: Not on file  ? Highest education level: Not on file  ?Occupational History  ? Not on file  ?Tobacco Use  ? Smoking status: Every Day  ?  Packs/day: 0.50  ?  Years: 40.00  ?  Pack years: 20.00  ?  Types: Cigarettes, E-cigarettes  ?  Last attempt to quit: 03/2019  ?  Years since quitting: 2.1  ? Smokeless tobacco: Never  ?Substance and Sexual Activity  ? Alcohol use: Yes  ?  Alcohol/week: 2.0 standard drinks  ?  Types: 2 Cans of beer per week  ? Drug use: No  ? Sexual activity: Yes  ?  Birth control/protection: None  ?Other Topics Concern  ? Not on file  ?Social History Narrative  ? Lives at home with wife.  ? ?Social Determinants of Health  ? ?Financial Resource Strain: Not on file  ?Food Insecurity: Not on file  ?Transportation Needs: Not on file  ?Physical Activity: Not on file  ?Stress: Not on file  ?Social Connections: Not on file  ?Intimate Partner Violence: Not on file  ? ?Social History  ? ?Social History Narrative  ? Lives at home  with wife.  ? ? ? ?ROS: Negative.  ? ? ? ?PE: ?HEENT: Negative. ?Lungs: Clear. ?Cardio: RR. ? ? ?Assessment/Plan: ? ?Proceed with planned right inguinal hernia repair with prosthetic mesh.  ? ? ?Forest Gleason Lucero Auzenne ?05/09/2021 ? ?  ?

## 2021-05-09 NOTE — Transfer of Care (Signed)
Immediate Anesthesia Transfer of Care Note ? ?Patient: Ryan Mejia ? ?Procedure(s) Performed: HERNIA REPAIR INGUINAL ADULT (Right) ?INSERTION OF MESH ? ?Patient Location: PACU ? ?Anesthesia Type:General ? ?Level of Consciousness: awake, alert  and oriented ? ?Airway & Oxygen Therapy: Patient Spontanous Breathing ? ?Post-op Assessment: Report given to RN and Post -op Vital signs reviewed and stable ? ?Post vital signs: Reviewed and stable ? ?Last Vitals:  ?Vitals Value Taken Time  ?BP 98/85 05/09/21 1500  ?Temp 36.3 ?C 05/09/21 1450  ?Pulse 66 05/09/21 1502  ?Resp 18 05/09/21 1502  ?SpO2 94 % 05/09/21 1502  ?Vitals shown include unvalidated device data. ? ?Last Pain:  ?Vitals:  ? 05/09/21 1450  ?TempSrc:   ?PainSc: 0-No pain  ?   ? ?  ? ?Complications: No notable events documented. ?

## 2021-05-09 NOTE — Anesthesia Procedure Notes (Signed)
Procedure Name: LMA Insertion ?Date/Time: 05/09/2021 12:47 PM ?Performed by: Hedda Slade, CRNA ?Pre-anesthesia Checklist: Patient identified, Patient being monitored, Timeout performed, Emergency Drugs available and Suction available ?Patient Re-evaluated:Patient Re-evaluated prior to induction ?Oxygen Delivery Method: Circle system utilized ?Preoxygenation: Pre-oxygenation with 100% oxygen ?Induction Type: IV induction ?Ventilation: Mask ventilation without difficulty ?LMA: LMA inserted ?LMA Size: 4.5 ?Number of attempts: 1 ?Placement Confirmation: positive ETCO2 ?Tube secured with: Tape ?Dental Injury: Teeth and Oropharynx as per pre-operative assessment  ? ? ? ? ?

## 2021-05-09 NOTE — Anesthesia Preprocedure Evaluation (Signed)
Anesthesia Evaluation  ?Patient identified by MRN, date of birth, ID band ?Patient awake ? ? ? ?Reviewed: ?Allergy & Precautions, NPO status , Patient's Chart, lab work & pertinent test results ? ?History of Anesthesia Complications ?Negative for: history of anesthetic complications ? ?Airway ?Mallampati: II ? ? ? ? ? ? Dental ? ?(+) Teeth Intact, Dental Advidsory Given, Missing, Chipped ?  ?Pulmonary ?shortness of breath and with exertion, neg sleep apnea, COPD,  COPD inhaler, neg recent URI, former smoker,  ?  ?breath sounds clear to auscultation ? ? ? ? ? ? Cardiovascular ?Exercise Tolerance: Good ?hypertension, (-) angina(-) Past MI and (-) Cardiac Stents (-) dysrhythmias (-) Valvular Problems/Murmurs ?Rhythm:Regular  ? ?  ?Neuro/Psych ?negative neurological ROS ?   ? GI/Hepatic ?negative GI ROS, Neg liver ROS,   ?Endo/Other  ?negative endocrine ROS ? Renal/GU ?negative Renal ROS  ? ?  ?Musculoskeletal ?negative musculoskeletal ROS ?(+)  ? Abdominal ?Normal abdominal exam  (+)   ?Peds ? 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 ? Reproductive/Obstetrics ? ?  ? ? ? ? ? ? ? ? ? ? ? ? ? ?  ?  ? ? ? ? ? ? ? ? ?Anesthesia Physical ? ?Anesthesia Plan ? ?ASA: 2 ? ?Anesthesia Plan: General  ? ?Post-op Pain Management:   ? ?Induction: Intravenous ? ?PONV Risk Score and Plan: 2 and Ondansetron, Dexamethasone and Treatment may vary due to age or medical condition ? ?Airway Management Planned: LMA ? ?Additional  Equipment:  ? ?Intra-op Plan:  ? ?Post-operative Plan: Extubation in OR ? ?Informed Consent: I have reviewed the patients History and Physical, chart, labs and discussed the procedure including the risks, benefits and alternatives for the proposed anesthesia with the patient or authorized representative who has indicated his/her understanding and acceptance.  ? ? ? ? ? ?Plan Discussed with: CRNA ? ?Anesthesia Plan Comments:   ? ? ? ? ? ? ?Anesthesia Quick Evaluation ? ?

## 2021-05-10 ENCOUNTER — Encounter: Payer: Self-pay | Admitting: General Surgery

## 2021-05-10 NOTE — Anesthesia Postprocedure Evaluation (Signed)
Anesthesia Post Note ? ?Patient: Ryan Mejia ? ?Procedure(s) Performed: HERNIA REPAIR INGUINAL ADULT (Right) ?INSERTION OF MESH ? ?Patient location during evaluation: PACU ?Anesthesia Type: General ?Level of consciousness: awake and alert ?Pain management: pain level controlled ?Vital Signs Assessment: post-procedure vital signs reviewed and stable ?Respiratory status: spontaneous breathing, nonlabored ventilation, respiratory function stable and patient connected to nasal cannula oxygen ?Cardiovascular status: blood pressure returned to baseline and stable ?Postop Assessment: no apparent nausea or vomiting ?Anesthetic complications: no ? ? ?No notable events documented. ? ? ?Last Vitals:  ?Vitals:  ? 05/09/21 1528 05/09/21 1537  ?BP:  (!) 146/81  ?Pulse: 63 75  ?Resp: 15   ?Temp: (!) 36.1 ?C (!) 36.3 ?C  ?SpO2: 93% 92%  ?  ?Last Pain:  ?Vitals:  ? 05/10/21 1013  ?TempSrc:   ?PainSc: 4   ? ? ?  ?  ?  ?  ?  ?  ? ?Martha Clan ? ? ? ? ?

## 2021-05-29 ENCOUNTER — Encounter: Payer: Self-pay | Admitting: Acute Care

## 2021-05-29 ENCOUNTER — Other Ambulatory Visit: Payer: Self-pay

## 2021-05-29 ENCOUNTER — Ambulatory Visit (INDEPENDENT_AMBULATORY_CARE_PROVIDER_SITE_OTHER): Payer: Medicare Other | Admitting: Acute Care

## 2021-05-29 DIAGNOSIS — Z87891 Personal history of nicotine dependence: Secondary | ICD-10-CM

## 2021-05-29 NOTE — Progress Notes (Signed)
Virtual Visit via Telephone Note ? ?I connected with Ryan Mejia on 05/29/21 at  9:00 AM EDT by telephone and verified that I am speaking with the correct person using two identifiers. ? ?Location: ?Patient: At Home ?Provider: Working from home ?  ?I discussed the limitations, risks, security and privacy concerns of performing an evaluation and management service by telephone and the availability of in person appointments. I also discussed with the patient that there may be a patient responsible charge related to this service. The patient expressed understanding and agreed to proceed. ? ? ? ?Shared Decision Making Visit Lung Cancer Screening Program ?([phone]) ? ? ?Eligibility: ?Age 76 y.o. ?Pack Years Smoking History Calculation 40 pack year smoking history ?(# packs/per year x # years smoked) ?Recent History of coughing up blood  no ?Unexplained weight loss? no ?( >Than 15 pounds within the last 6 months ) ?Prior History Lung / other cancer no ?(Diagnosis within the last 5 years already requiring surveillance chest CT Scans). ?Smoking Status Former Smoker ?Former Smokers: Years since quit: 2 years ? Quit Date: 03/2019 ? ?Visit Components: ?Discussion included one or more decision making aids. yes ?Discussion included risk/benefits of screening. yes ?Discussion included potential follow up diagnostic testing for abnormal scans. yes ?Discussion included meaning and risk of over diagnosis. yes ?Discussion included meaning and risk of False Positives. yes ?Discussion included meaning of total radiation exposure. yes ? ?Counseling Included: ?Importance of adherence to annual lung cancer LDCT screening. yes ?Impact of comorbidities on ability to participate in the program. yes ?Ability and willingness to under diagnostic treatment. yes ? ?Smoking Cessation Counseling: ?Current Smokers:  ?Discussed importance of smoking cessation. yes ?Information about tobacco cessation classes and interventions provided to patient.  yes ?Patient provided with "ticket" for LDCT Scan. yes ?Symptomatic Patient. no ? Counseling NA ?Diagnosis Code: Tobacco Use Z72.0 ?Asymptomatic Patient yes ? Counseling (Intermediate counseling: > three minutes counseling) T0177 ?Former Smokers:  ?Discussed the importance of maintaining cigarette abstinence. yes ?Diagnosis Code: Personal History of Nicotine Dependence. L39.030 ?Information about tobacco cessation classes and interventions provided to patient. Yes ?Patient provided with "ticket" for LDCT Scan. yes ?Written Order for Lung Cancer Screening with LDCT placed in Epic. Yes ?(CT Chest Lung Cancer Screening Low Dose W/O CM) [id number] ?Z12.2-Screening of respiratory organs ?Z87.891-Personal history of nicotine dependence ? ?I spent 25 minutes of face to face time/virtual visit time  with  Mr. Ryan Mejia discussing the risks and benefits of lung cancer screening. We took the time to pause the power point at intervals to allow for questions to be asked and answered to ensure understanding. We discussed that  He  had taken the single most powerful action possible to decrease  His risk of developing lung cancer when he quit smoking. I counseled him to remain smoke free, and to contact me if he ever had the desire to smoke again so that I can provide resources and tools to help support the effort to remain smoke free. We discussed the time and location of the scan, and that either  Doroteo Glassman RN, Joella Prince, RN or I  or I will call / send a letter with the results within  24-72 hours of receiving them. He has the office contact information in the event he needs to speak with me,  he verbalized understanding of all of the above and had no further questions upon leaving the office.  ? ? ? ?I explained to the patient that there has been a high  incidence of coronary artery disease noted on these exams. I explained that this is a non-gated exam therefore degree or severity cannot be determined. This patient is  currently on statin therapy. I have asked the patient to follow-up with their PCP regarding any incidental finding of coronary artery disease and management with diet or medication as they feel is clinically indicated. The patient verbalized understanding of the above and had no further questions. ?  ? ? ?Ryan Spatz, NP ?05/29/2021 ? ? ? ? ? ? ?

## 2021-05-29 NOTE — Patient Instructions (Signed)
Thank you for participating in the [facility] Lung Cancer Screening Program. °It was our pleasure to meet you today. °We will call you with the results of your scan within the next few days. °Your scan will be assigned a Lung RADS category score by the physicians reading the scans.  °This Lung RADS score determines follow up scanning.  °See below for description of categories, and follow up screening recommendations. °We will be in touch to schedule your follow up screening annually or based on recommendations of our providers. °We will fax a copy of your scan results to your Primary Care Physician, or the physician who referred you to the program, to ensure they have the results. °Please call the office if you have any questions or concerns regarding your scanning experience or results.  °Our office number is [phone]. °Please speak with Denise Phelps, RN. She is our Lung Cancer Screening RN. °If she is unavailable when you call, please have the office staff send her a message. She will return your call at her earliest convenience. °Remember, if your scan is normal, we will scan you annually as long as you continue to meet the criteria for the program. (Age 55-77, Current smoker or smoker who has quit within the last 15 years). °If you are a smoker, remember, quitting is the single most powerful action that you can take to decrease your risk of lung cancer and other pulmonary, breathing related problems. °We know quitting is hard, and we are here to help.  °Please let us know if there is anything we can do to help you meet your goal of quitting. °If you are a former smoker, congratulations. We are proud of you! Remain smoke free! °Remember you can refer friends or family members through the number above.  °We will screen them to make sure they meet criteria for the program. °Thank you for helping us take better care of you by participating in Lung Screening. ° °You can receive free nicotine replacement therapy  ( patches, gum or mints) by calling 1-800-QUIT NOW. Please call so we can get you on the path to becoming  a non-smoker. I know it is hard, but you can do this! ° °Lung RADS Categories: ° °Lung RADS 1: no nodules or definitely non-concerning nodules.  °Recommendation is for a repeat annual scan in 12 months. ° °Lung RADS 2:  nodules that are non-concerning in appearance and behavior with a very low likelihood of becoming an active cancer. °Recommendation is for a repeat annual scan in 12 months. ° °Lung RADS 3: nodules that are probably non-concerning , includes nodules with a low likelihood of becoming an active cancer.  Recommendation is for a 6-month repeat screening scan. Often noted after an upper respiratory illness. We will be in touch to make sure you have no questions, and to schedule your 6-month scan. ° °Lung RADS 4 A: nodules with concerning findings, recommendation is most often for a follow up scan in 3 months or additional testing based on our provider's assessment of the scan. We will be in touch to make sure you have no questions and to schedule the recommended 3 month follow up scan. ° °Lung RADS 4 B:  indicates findings that are concerning. We will be in touch with you to schedule additional diagnostic testing based on our provider's  assessment of the scan. ° °Hypnosis for smoking cessation  °[employer] °[phone] ° °Acupuncture for smoking cessation  °[facility] °[phone]  °

## 2021-05-30 ENCOUNTER — Other Ambulatory Visit: Payer: Self-pay

## 2021-05-30 ENCOUNTER — Ambulatory Visit
Admission: RE | Admit: 2021-05-30 | Discharge: 2021-05-30 | Disposition: A | Discharge status: Home / Self Care | Payer: Medicare Other | Source: Ambulatory Visit | Attending: Acute Care | Admitting: Acute Care

## 2021-05-30 DIAGNOSIS — Z87891 Personal history of nicotine dependence: Secondary | ICD-10-CM | POA: Insufficient documentation

## 2021-06-01 ENCOUNTER — Other Ambulatory Visit: Payer: Self-pay

## 2021-06-01 DIAGNOSIS — Z87891 Personal history of nicotine dependence: Secondary | ICD-10-CM

## 2021-06-25 ENCOUNTER — Emergency Department
Admission: EM | Admit: 2021-06-25 | Discharge: 2021-06-25 | Disposition: A | Discharge status: Home / Self Care | Payer: Medicare Other | Attending: Emergency Medicine | Admitting: Emergency Medicine

## 2021-06-25 ENCOUNTER — Other Ambulatory Visit: Payer: Self-pay

## 2021-06-25 ENCOUNTER — Encounter: Payer: Self-pay | Admitting: Emergency Medicine

## 2021-06-25 DIAGNOSIS — Z85828 Personal history of other malignant neoplasm of skin: Secondary | ICD-10-CM | POA: Insufficient documentation

## 2021-06-25 DIAGNOSIS — R1084 Generalized abdominal pain: Secondary | ICD-10-CM

## 2021-06-25 DIAGNOSIS — F17200 Nicotine dependence, unspecified, uncomplicated: Secondary | ICD-10-CM | POA: Diagnosis not present

## 2021-06-25 DIAGNOSIS — K5900 Constipation, unspecified: Secondary | ICD-10-CM | POA: Diagnosis not present

## 2021-06-25 DIAGNOSIS — I10 Essential (primary) hypertension: Secondary | ICD-10-CM | POA: Diagnosis not present

## 2021-06-25 DIAGNOSIS — R1030 Lower abdominal pain, unspecified: Secondary | ICD-10-CM | POA: Diagnosis present

## 2021-06-25 LAB — CBC
HCT: 42.6 % (ref 39.0–52.0)
Hemoglobin: 13.8 g/dL (ref 13.0–17.0)
MCH: 30.6 pg (ref 26.0–34.0)
MCHC: 32.4 g/dL (ref 30.0–36.0)
MCV: 94.5 fL (ref 80.0–100.0)
Platelets: 199 10*3/uL (ref 150–400)
RBC: 4.51 MIL/uL (ref 4.22–5.81)
RDW: 13 % (ref 11.5–15.5)
WBC: 7.7 10*3/uL (ref 4.0–10.5)
nRBC: 0 % (ref 0.0–0.2)

## 2021-06-25 LAB — COMPREHENSIVE METABOLIC PANEL
ALT: 20 U/L (ref 0–44)
AST: 20 U/L (ref 15–41)
Albumin: 4.3 g/dL (ref 3.5–5.0)
Alkaline Phosphatase: 72 U/L (ref 38–126)
Anion gap: 9 (ref 5–15)
BUN: 17 mg/dL (ref 8–23)
CO2: 25 mmol/L (ref 22–32)
Calcium: 9 mg/dL (ref 8.9–10.3)
Chloride: 104 mmol/L (ref 98–111)
Creatinine, Ser: 0.76 mg/dL (ref 0.61–1.24)
GFR, Estimated: >60 mL/min (ref >60)
Glucose, Bld: 104 mg/dL — ABNORMAL HIGH (ref 70–99)
Potassium: 4.1 mmol/L (ref 3.5–5.1)
Sodium: 138 mmol/L (ref 135–145)
Total Bilirubin: 0.7 mg/dL (ref 0.3–1.2)
Total Protein: 6.8 g/dL (ref 6.5–8.1)

## 2021-06-25 LAB — LIPASE, BLOOD: Lipase: 38 U/L (ref 11–51)

## 2021-06-25 NOTE — ED Triage Notes (Addendum)
C?O 'severe' abdominal pain. C/O lower abdominal pain since this morning. Last BM 06/22/2021 ? ?Patient had a urinalysis done today at [facility].  Results in care everywhere. ?

## 2021-06-25 NOTE — Discharge Instructions (Signed)
I recommend starting to take the MiraLAX every day so that you are regular.  Reasons to return to the emergency department include if your abdominal pain comes back and worsens is not improving you are unable to eat or drink vomiting or you become significantly constipated again.  Otherwise please follow-up with your primary care provider. ?

## 2021-06-25 NOTE — ED Provider Notes (Signed)
? ?[facility] ?Provider Note ? ? ? Event Date/Time  ? First MD Initiated Contact with Patient 06/25/21 1249   ?  (approximate) ? ? ?History  ? ?Abdominal Pain ? ? ?HPI ? ?Ryan Mejia is a 76 y.o. male past medical history of recent hernia repair 6 weeks ago, BPH, hyperlipidemia, hypertension who presents after an episode of abdominal pain.  Patient had not had a bowel movement for days.  After eating breakfast today he had severe lower abdominal pain feeling like he needed to have a bowel movement.  It made him feel like he was going to pass out.  Did not have any vomiting.  No fevers chills.  In the ED patient had a bowel movement prior to me evaluating him and his pain completely went away.  Michela Pitcher it was quite large.  Did take a Dulcolax prior to coming. ?  ? ?Past Medical History:  ?Diagnosis Date  ? Aortic atherosclerosis ([place])   ? BPH (benign prostatic hyperplasia)   ? Cataracts, bilateral   ? Chronic prostatitis   ? Elevated PSA   ? Goiter   ? Goiter, nodular   ? Herpes simplex   ? History of colonic polyps   ? History of elevated PSA   ? Hyperlipidemia   ? Hypertension   ? Labyrinthitis   ? Nocturia   ? Skin cancer, basal cell   ? ? ?Patient Active Problem List  ? Diagnosis Date Noted  ? Aortic atherosclerosis ([place]) 04/06/2019  ? Tobacco abuse 03/04/2019  ? HTN (hypertension) 03/11/2018  ? HLD (hyperlipidemia) 03/11/2018  ? BCC (basal cell carcinoma), ear, right 03/20/2017  ? Disc disease, degenerative, lumbar or lumbosacral 01/30/2017  ? History of nonmelanoma skin cancer 04/25/2016  ? Family history of prostate cancer 08/21/2015  ? BPH with obstruction/lower urinary tract symptoms 08/22/2014  ? History of elevated PSA 08/22/2014  ? Goiter, nontoxic, multinodular 08/17/2014  ? CA skin, basal cell 08/17/2014  ? Acute labyrinthitis 02/27/2014  ? ? ? ?Physical Exam  ?Triage Vital Signs: ?ED Triage Vitals  ?Enc Vitals Group  ?   BP 06/25/21 1117 (!) 146/80  ?   Pulse Rate 06/25/21 1117  86  ?   Resp 06/25/21 1117 16  ?   Temp 06/25/21 1117 97.9 ?F (36.6 ?C)  ?   Temp Source 06/25/21 1117 Oral  ?   SpO2 06/25/21 1117 95 %  ?   Weight 06/25/21 1115 179 lb 14.3 oz (81.6 kg)  ?   Height 06/25/21 1115 '5\' 10"'$  (1.778 m)  ?   Head Circumference --   ?   Peak Flow --   ?   Pain Score 06/25/21 1114 6  ?   Pain Loc --   ?   Pain Edu? --   ?   Excl. in [place]? --   ? ? ?Most recent vital signs: ?Vitals:  ? 06/25/21 1117 06/25/21 1255  ?BP: (!) 146/80 127/73  ?Pulse: 86 63  ?Resp: 16 16  ?Temp: 97.9 ?F (36.6 ?C)   ?SpO2: 95% 99%  ? ? ? ?General: Awake, no distress.  ?CV:  Good peripheral perfusion.  ?Resp:  Normal effort.  ?Abd:  No distention.  Abdomen is soft and completely nontender throughout ?Neuro:             Awake, Alert, Oriented x 3  ?Other:   ? ? ?ED Results / Procedures / Treatments  ?Labs ?(all labs ordered are listed, but only abnormal results  are displayed) ?Labs Reviewed  ?COMPREHENSIVE METABOLIC PANEL - Abnormal; Notable for the following components:  ?    Result Value  ? Glucose, Bld 104 (*)   ? All other components within normal limits  ?LIPASE, BLOOD  ?CBC  ? ? ? ?EKG ? ?EKG interpretation performed by myself: NSR, nml axis, nml intervals, no acute ischemic changes ? ? ? ?RADIOLOGY ? ? ?PROCEDURES: ? ?Critical Care performed: No ? ?Procedures ? ?The patient is on the cardiac monitor to evaluate for evidence of arrhythmia and/or significant heart rate changes. ? ? ?MEDICATIONS ORDERED IN ED: ?Medications - No data to display ? ? ?IMPRESSION / MDM / ASSESSMENT AND PLAN / ED COURSE  ?I reviewed the triage vital signs and the nursing notes. ?             ?               ? ?Differential diagnosis includes, but is not limited to, constipation, vagal episode, less likely diverticulitis pancreatitis biliary colic ? ?76 year old male presents after an episode of abdominal pain and constipation.  Had not had a BM for 4 days.  Describes significant lower abdominal pain that made him feel like he was going  to pass out I suspect vasovagal in nature.  While in the ED patient had a large BM and now is completely asymptomatic.  Appears quite well on exam tolerating p.o. abdomen is nontender throughout.  Labs all benign normal lipase CBC and CMP.  I suspect that his pain was constipation related now that he is completely pain-free tolerating p.o. do not feel that CT is indicated.  I recommended he start taking MiraLAX daily to avoid future episode like this.  He is otherwise appropriate discharge discussed return precautions for return of pain fever etc. ? ?  ? ? ?FINAL CLINICAL IMPRESSION(S) / ED DIAGNOSES  ? ?Final diagnoses:  ?Generalized abdominal pain  ?Constipation, unspecified constipation type  ? ? ? ?Rx / DC Orders  ? ?ED Discharge Orders   ? ? None  ? ?  ? ? ? ?Note:  This document was prepared using Dragon voice recognition software and may include unintentional dictation errors. ?  ?Rada Hay, MD ?06/25/21 1350 ? ?

## 2021-10-15 ENCOUNTER — Encounter
Admission: RE | Disposition: A | Discharge status: Home / Self Care | Payer: Self-pay | Source: Ambulatory Visit | Attending: Gastroenterology

## 2021-10-15 ENCOUNTER — Ambulatory Visit: Payer: Medicare Other | Admitting: Anesthesiology

## 2021-10-15 ENCOUNTER — Ambulatory Visit
Admission: RE | Admit: 2021-10-15 | Discharge: 2021-10-15 | Disposition: A | Discharge status: Home / Self Care | Payer: Medicare Other | Source: Ambulatory Visit | Attending: Gastroenterology | Admitting: Gastroenterology

## 2021-10-15 ENCOUNTER — Encounter: Payer: Self-pay | Admitting: *Deleted

## 2021-10-15 DIAGNOSIS — I10 Essential (primary) hypertension: Secondary | ICD-10-CM | POA: Insufficient documentation

## 2021-10-15 DIAGNOSIS — N400 Enlarged prostate without lower urinary tract symptoms: Secondary | ICD-10-CM | POA: Insufficient documentation

## 2021-10-15 DIAGNOSIS — D122 Benign neoplasm of ascending colon: Secondary | ICD-10-CM | POA: Diagnosis not present

## 2021-10-15 DIAGNOSIS — Z86010 Personal history of colonic polyps: Secondary | ICD-10-CM | POA: Insufficient documentation

## 2021-10-15 DIAGNOSIS — K5730 Diverticulosis of large intestine without perforation or abscess without bleeding: Secondary | ICD-10-CM | POA: Insufficient documentation

## 2021-10-15 DIAGNOSIS — Z1211 Encounter for screening for malignant neoplasm of colon: Secondary | ICD-10-CM | POA: Insufficient documentation

## 2021-10-15 DIAGNOSIS — E785 Hyperlipidemia, unspecified: Secondary | ICD-10-CM | POA: Insufficient documentation

## 2021-10-15 DIAGNOSIS — Z87891 Personal history of nicotine dependence: Secondary | ICD-10-CM | POA: Insufficient documentation

## 2021-10-15 DIAGNOSIS — Z85828 Personal history of other malignant neoplasm of skin: Secondary | ICD-10-CM | POA: Insufficient documentation

## 2021-10-15 DIAGNOSIS — K640 First degree hemorrhoids: Secondary | ICD-10-CM | POA: Diagnosis not present

## 2021-10-15 HISTORY — PX: COLONOSCOPY WITH PROPOFOL: SHX5780

## 2021-10-15 SURGERY — COLONOSCOPY WITH PROPOFOL
Anesthesia: General

## 2021-10-15 MED ORDER — SODIUM CHLORIDE 0.9 % IV SOLN
INTRAVENOUS | Status: DC
  Administered 2021-10-15: 20 mL/h via INTRAVENOUS

## 2021-10-15 MED ORDER — PROPOFOL 10 MG/ML IV BOLUS
INTRAVENOUS | Status: DC | PRN
  Administered 2021-10-15: 140 mg via INTRAVENOUS

## 2021-10-15 MED ORDER — PROPOFOL 10 MG/ML IV BOLUS
INTRAVENOUS | Status: AC
  Filled 2021-10-15: qty 20

## 2021-10-15 MED ORDER — PROPOFOL 500 MG/50ML IV EMUL
INTRAVENOUS | Status: DC | PRN
  Administered 2021-10-15: 120 ug/kg/min via INTRAVENOUS

## 2021-10-15 NOTE — Transfer of Care (Addendum)
Immediate Anesthesia Transfer of Care Note  Patient: Ryan Mejia  Procedure(s) Performed: COLONOSCOPY WITH PROPOFOL  Patient Location: PACU and Endoscopy Unit  Anesthesia Type:General  Level of Consciousness: awake  Airway & Oxygen Therapy: Patient Spontanous Breathing  Post-op Assessment: Report given to RN  Post vital signs: stable  Last Vitals:  Vitals Value Taken Time  BP    Temp    Pulse    Resp    SpO2      Last Pain:  Vitals:   10/15/21 1028  TempSrc: Temporal  PainSc: 0-No pain         Complications: No notable events documented.

## 2021-10-15 NOTE — Anesthesia Preprocedure Evaluation (Signed)
Anesthesia Evaluation  Patient identified by MRN, date of birth, ID band Patient awake    Reviewed: Allergy & Precautions, NPO status , Patient's Chart, lab work & pertinent test results  Airway Mallampati: III  TM Distance: >3 FB Neck ROM: Full    Dental  (+) Teeth Intact, Partial Upper   Pulmonary neg pulmonary ROS, Current Smoker and Patient abstained from smoking., former smoker,    Pulmonary exam normal breath sounds clear to auscultation       Cardiovascular hypertension, negative cardio ROS Normal cardiovascular exam Rhythm:Regular     Neuro/Psych negative neurological ROS  negative psych ROS   GI/Hepatic negative GI ROS, Neg liver ROS,   Endo/Other  negative endocrine ROS  Renal/GU negative Renal ROS  negative genitourinary   Musculoskeletal  (+) Arthritis ,   Abdominal Normal abdominal exam  (+)   Peds negative pediatric ROS (+)  Hematology negative hematology ROS (+)   Anesthesia Other Findings Past Medical History: No date: Aortic atherosclerosis (HCC) No date: BPH (benign prostatic hyperplasia) No date: Cataracts, bilateral No date: Chronic prostatitis No date: Elevated PSA 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, nodular No date: Herpes simplex No date: History of colonic polyps No date: History of elevated PSA No date: Hyperlipidemia No date: Hypertension No date: Labyrinthitis No date: Nocturia No date: Skin cancer, basal cell  Past Surgical History: 04/18/2016: COLONOSCOPY WITH PROPOFOL; N/A     Comment:  Procedure: COLONOSCOPY WITH PROPOFOL;  Surgeon: Lollie Sails, MD;  Location[facility] ENDOSCOPY;  Service:               Endoscopy;  Laterality: N/A; No date: EYE SURGERY; Bilateral     Comment:  cataracts 05/09/2021: INGUINAL HERNIA REPAIR; Right     Comment:  Procedure: HERNIA REPAIR INGUINAL ADULT;  Surgeon:               Robert Bellow, MD;  Location: [facility] ORS;  Service:                General;  Laterality: Right; 05/09/2021: INSERTION OF MESH     Comment:  Procedure: INSERTION OF MESH;  Surgeon: Robert Bellow, MD;  Location: [facility] ORS;  Service: General;; No date: SKIN CANCER DESTRUCTION No date: TONSILLECTOMY     Comment:  child  BMI    Body Mass Index: 24.41 kg/m      Reproductive/Obstetrics negative OB ROS                             Anesthesia Physical Anesthesia Plan  ASA: 2  Anesthesia Plan: General   Post-op Pain Management:    Induction: Intravenous  PONV Risk Score and Plan: Propofol infusion and TIVA  Airway Management Planned: Natural Airway  Additional Equipment:   Intra-op Plan:   Post-operative Plan:   Informed Consent: I have reviewed the patients History and Physical, chart, labs and discussed the procedure including the risks, benefits and alternatives for the proposed anesthesia with the patient or authorized representative who has indicated his/her understanding and acceptance.     Dental Advisory Given  Plan Discussed with: CRNA and Surgeon  Anesthesia Plan Comments:         Anesthesia Quick Evaluation

## 2021-10-15 NOTE — Op Note (Signed)
[facility] Gastroenterology Patient Name: Ryan Mejia Procedure Date: 10/15/2021 11:38 AM MRN: [id number] Account #: [id number] Date of Birth: 01-26-1946 Admit Type: Outpatient Age: 76 Room: [facility] ENDO ROOM 3 Gender: Male Note Status: Finalized Instrument Name: Jasper Riling [id number] Procedure:             Colonoscopy Indications:           High risk colon cancer surveillance: Personal history                         of non-advanced adenoma, Last colonoscopy 5 years ago Providers:             Andrey Farmer MD, MD Referring MD:          Leonie Douglas. Doy Hutching, MD (Referring MD) Medicines:             Monitored Anesthesia Care Complications:         No immediate complications. Estimated blood loss:                         Minimal. Procedure:             Pre-Anesthesia Assessment:                        - Prior to the procedure, a History and Physical was                         performed, and patient medications and allergies were                         reviewed. The patient is competent. The risks and                         benefits of the procedure and the sedation options and                         risks were discussed with the patient. All questions                         were answered and informed consent was obtained.                         Patient identification and proposed procedure were                         verified by the physician, the nurse, the                         anesthesiologist, the anesthetist and the technician                         in the endoscopy suite. Mental Status Examination:                         alert and oriented. Airway Examination: normal                         oropharyngeal airway and neck mobility. Respiratory  Examination: clear to auscultation. CV Examination:                         normal. Prophylactic Antibiotics: The patient does not                         require prophylactic antibiotics.  Prior                         Anticoagulants: The patient has taken no previous                         anticoagulant or antiplatelet agents. ASA Grade                         Assessment: II - A patient with mild systemic disease.                         After reviewing the risks and benefits, the patient                         was deemed in satisfactory condition to undergo the                         procedure. The anesthesia plan was to use monitored                         anesthesia care (MAC). Immediately prior to                         administration of medications, the patient was                         re-assessed for adequacy to receive sedatives. The                         heart rate, respiratory rate, oxygen saturations,                         blood pressure, adequacy of pulmonary ventilation, and                         response to care were monitored throughout the                         procedure. The physical status of the patient was                         re-assessed after the procedure.                        After obtaining informed consent, the colonoscope was                         passed under direct vision. Throughout the procedure,                         the patient's blood pressure, pulse, and oxygen  saturations were monitored continuously. The                         Colonoscope was introduced through the anus and                         advanced to the the cecum, identified by appendiceal                         orifice and ileocecal valve. The colonoscopy was                         somewhat difficult due to a tortuous colon. The                         patient tolerated the procedure well. The quality of                         the bowel preparation was good. Findings:      The perianal and digital rectal examinations were normal.      A 2 mm polyp was found in the ascending colon. The polyp was sessile.       The polyp was  removed with a jumbo cold forceps. Resection and retrieval       were complete. Estimated blood loss was minimal.      Multiple small-mouthed diverticula were found in the sigmoid colon.      Internal hemorrhoids were found during retroflexion. The hemorrhoids       were Grade I (internal hemorrhoids that do not prolapse).      The exam was otherwise without abnormality on direct and retroflexion       views. Impression:            - One 2 mm polyp in the ascending colon, removed with                         a jumbo cold forceps. Resected and retrieved.                        - Diverticulosis in the sigmoid colon.                        - Internal hemorrhoids.                        - The examination was otherwise normal on direct and                         retroflexion views. Recommendation:        - Discharge patient to home.                        - Resume previous diet.                        - Continue present medications.                        - Await pathology results.                        -  Repeat colonoscopy is not recommended due to current                         age (105 years or older) for surveillance.                        - Return to referring physician as previously                         scheduled. Procedure Code(s):     --- Professional ---                        [phone], Colonoscopy, flexible; with biopsy, single or                         multiple Diagnosis Code(s):     --- Professional ---                        Z86.010, Personal history of colonic polyps                        K63.5, Polyp of colon                        K64.0, First degree hemorrhoids                        K57.30, Diverticulosis of large intestine without                         perforation or abscess without bleeding CPT copyright 2019 American Medical Association. All rights reserved. The codes documented in this report are preliminary and upon coder review may  be revised to meet current  compliance requirements. Andrey Farmer MD, MD 10/15/2021 12:13:30 PM Number of Addenda: 0 Note Initiated On: 10/15/2021 11:38 AM Scope Withdrawal Time: 0 hours 10 minutes 39 seconds  Total Procedure Duration: 0 hours 24 minutes 1 second  Estimated Blood Loss:  Estimated blood loss was minimal.      [facility]

## 2021-10-15 NOTE — Anesthesia Postprocedure Evaluation (Signed)
Anesthesia Post Note  Patient: Ryan Mejia  Procedure(s) Performed: COLONOSCOPY WITH PROPOFOL  Patient location during evaluation: PACU Anesthesia Type: General Level of consciousness: awake and awake and alert Pain management: pain level controlled Vital Signs Assessment: post-procedure vital signs reviewed and stable Respiratory status: spontaneous breathing Cardiovascular status: stable Anesthetic complications: no   No notable events documented.   Last Vitals:  Vitals:   10/15/21 1222 10/15/21 1232  BP:    Pulse: (!) 58 (!) 56  Resp:  16  Temp:    SpO2: 99% 99%    Last Pain:  Vitals:   10/15/21 1232  TempSrc:   PainSc: 0-No pain                 VAN STAVEREN,Yannis Gumbs

## 2021-10-15 NOTE — H&P (Signed)
Outpatient short stay form Pre-procedure 10/15/2021  Ryan Rubenstein, MD  Primary Physician: Idelle Crouch, MD  Reason for visit:  Surveillance  History of present illness:    76 y/o gentleman with history of HLD and recent inguinal hernia repair here for surveillance colonoscopy. Last colonoscopy was in 2018 with hyperplastic polyps but Ta's on the colonoscopy before that. No blood thinners. No family history of GI malignancies.    Current Facility-Administered Medications:    0.9 %  sodium chloride infusion, , Intravenous, Continuous, Johnye Kist, Hilton Cork, MD, Last Rate: 20 mL/hr at 10/15/21 1043, 20 mL/hr at 10/15/21 1043  Medications Prior to Admission  Medication Sig Dispense Refill Last Dose   acyclovir (ZOVIRAX) 400 MG tablet Take 400 mg by mouth 2 (two) times daily.   10/15/2021 at 0600   fexofenadine (ALLEGRA) 180 MG tablet Take 180 mg by mouth daily. @ 3PM   Past Week   finasteride (PROSCAR) 5 MG tablet TAKE 1 TABLET BY MOUTH  DAILY (Patient taking differently: 5 mg daily. @ 3PM) 90 tablet 0 10/15/2021 at 0600   HYDROcodone-acetaminophen (NORCO/VICODIN) 5-325 MG tablet Take 1 tablet by mouth every 4 (four) hours as needed for moderate pain. 15 tablet 0 Past Week   hydroxypropyl methylcellulose / hypromellose (ISOPTO TEARS / GONIOVISC) 2.5 % ophthalmic solution Place 1 drop into both eyes 3 (three) times daily as needed for dry eyes.   Past Week   Ibuprofen 200 MG CAPS Take 400 mg by mouth every 6 (six) hours as needed (pain).   Past Week   rosuvastatin (CRESTOR) 5 MG tablet Take 5 mg by mouth daily. @ 3PM   Past Week   tamsulosin (FLOMAX) 0.4 MG CAPS capsule Take 0.4 mg by mouth daily. @ 3PM   Past Week     Allergies  Allergen Reactions   Celebrex [Celecoxib]     Pain in abdomen   Lipitor [Atorvastatin]     Muscle aches     Past Medical History:  Diagnosis Date   Aortic atherosclerosis (HCC)    BPH (benign prostatic hyperplasia)    Cataracts, bilateral     Chronic prostatitis    Elevated PSA    Goiter    Goiter, nodular    Herpes simplex    History of colonic polyps    History of elevated PSA    Hyperlipidemia    Hypertension    Labyrinthitis    Nocturia    Skin cancer, basal cell     Review of systems:  Otherwise negative.    Physical Exam  Gen: Alert, oriented. Appears stated age.  HEENT: PERRLA. Lungs: No respiratory distress CV: RRR Abd: soft, benign, no masses Ext: No edema    Planned procedures: Proceed with colonoscopy. The patient understands the nature of the planned procedure, indications, risks, alternatives and potential complications including but not limited to bleeding, infection, perforation, damage to internal organs and possible oversedation/side effects from anesthesia. The patient agrees and gives consent to proceed.  Please refer to procedure notes for findings, recommendations and patient disposition/instructions.     Ryan Rubenstein, MD [facility] Gastroenterology

## 2021-10-16 ENCOUNTER — Encounter: Payer: Self-pay | Admitting: Gastroenterology

## 2021-11-28 LAB — SURGICAL PATHOLOGY

## 2022-03-12 ENCOUNTER — Encounter: Payer: Self-pay | Admitting: Urology

## 2022-03-12 ENCOUNTER — Ambulatory Visit: Payer: Medicare Other | Admitting: Urology

## 2022-03-12 VITALS — BP 131/84 | HR 100 | Ht 71.0 in | Wt 187.8 lb

## 2022-03-12 DIAGNOSIS — N138 Other obstructive and reflux uropathy: Secondary | ICD-10-CM | POA: Diagnosis not present

## 2022-03-12 DIAGNOSIS — Z125 Encounter for screening for malignant neoplasm of prostate: Secondary | ICD-10-CM | POA: Diagnosis not present

## 2022-03-12 DIAGNOSIS — N401 Enlarged prostate with lower urinary tract symptoms: Secondary | ICD-10-CM

## 2022-03-12 LAB — BLADDER SCAN AMB NON-IMAGING

## 2022-03-12 MED ORDER — TAMSULOSIN HCL 0.4 MG PO CAPS: 0.4000 mg | ORAL_CAPSULE | Freq: Every day | ORAL | 3 refills | Status: DC

## 2022-03-12 MED ORDER — FINASTERIDE 5 MG PO TABS: 5.0000 mg | ORAL_TABLET | Freq: Every day | ORAL | 3 refills | Status: DC

## 2022-03-12 NOTE — Progress Notes (Signed)
   03/12/2022 9:13 AM   Ryan Mejia May 14, 1945 [id number]  Reason for visit: Follow up BPH/LUTS, history of testicular pain, PSA screening  HPI: 77 year old male with history of gross hematuria with a negative workup and cystoscopy showing a friable prostate with no median lobe.  He has been on maximal medical therapy with Flomax and finasteride long-term.  We previously had discussed UroLift, but he is comfortable continuing medications at this time.  He stopped the Flomax briefly this year and did notice worsening of the urinary symptoms.  Really denies any significant urinary complaints today.  He had an episode of some darker colored urine this fall and was empirically treated with a week of Cipro by his PCP.  He denies any dysuria or recurrent gross hematuria.  PVR today normal at 150 mL.  Urinalysis in April 2023 was benign.  He also has a history of testicular pain.  At our last visit I recommended behavioral strategies including snug fitting underwear, and he denies any problems since that time.  PSA in January 2023 was 1.6(corrected for finasteride 3.2) within the normal range.  No further screening needed per guideline recommendations.  Flomax and finasteride refilled RTC 1 year PVR Consider UroLift in the future if worsening urinary symptoms  [place], [place] [address] [place], [place] [address] [phone]

## 2022-03-14 ENCOUNTER — Other Ambulatory Visit: Payer: Self-pay

## 2022-03-14 DIAGNOSIS — N401 Enlarged prostate with lower urinary tract symptoms: Secondary | ICD-10-CM

## 2022-03-14 MED ORDER — FINASTERIDE 5 MG PO TABS: 5.0000 mg | ORAL_TABLET | Freq: Every day | ORAL | 3 refills | Status: DC

## 2022-03-14 MED ORDER — TAMSULOSIN HCL 0.4 MG PO CAPS: 0.4000 mg | ORAL_CAPSULE | Freq: Every day | ORAL | 3 refills | Status: DC

## 2022-04-09 ENCOUNTER — Other Ambulatory Visit: Payer: Self-pay | Admitting: Internal Medicine

## 2022-04-09 DIAGNOSIS — Z720 Tobacco use: Secondary | ICD-10-CM

## 2022-04-09 DIAGNOSIS — F17210 Nicotine dependence, cigarettes, uncomplicated: Secondary | ICD-10-CM

## 2022-05-31 ENCOUNTER — Ambulatory Visit
Admission: RE | Admit: 2022-05-31 | Discharge: 2022-05-31 | Disposition: A | Discharge status: Home / Self Care | Payer: Medicare Other | Source: Ambulatory Visit | Attending: Acute Care | Admitting: Acute Care

## 2022-05-31 DIAGNOSIS — Z87891 Personal history of nicotine dependence: Secondary | ICD-10-CM | POA: Diagnosis present

## 2022-06-03 ENCOUNTER — Other Ambulatory Visit: Payer: Self-pay | Admitting: Acute Care

## 2022-06-03 DIAGNOSIS — Z87891 Personal history of nicotine dependence: Secondary | ICD-10-CM

## 2022-06-03 DIAGNOSIS — Z122 Encounter for screening for malignant neoplasm of respiratory organs: Secondary | ICD-10-CM

## 2023-06-02 ENCOUNTER — Ambulatory Visit
Admission: RE | Admit: 2023-06-02 | Discharge: 2023-06-02 | Disposition: A | Discharge status: Home / Self Care | Payer: Medicare Other | Source: Ambulatory Visit | Attending: Acute Care | Admitting: Acute Care

## 2023-06-02 DIAGNOSIS — Z87891 Personal history of nicotine dependence: Secondary | ICD-10-CM | POA: Diagnosis present

## 2023-06-02 DIAGNOSIS — Z122 Encounter for screening for malignant neoplasm of respiratory organs: Secondary | ICD-10-CM | POA: Diagnosis present

## 2023-06-04 ENCOUNTER — Ambulatory Visit: Admitting: Urology

## 2023-07-02 ENCOUNTER — Ambulatory Visit: Admitting: Urology

## 2023-07-02 VITALS — BP 130/68 | HR 69 | Wt 178.0 lb

## 2023-07-02 DIAGNOSIS — N401 Enlarged prostate with lower urinary tract symptoms: Secondary | ICD-10-CM

## 2023-07-02 DIAGNOSIS — N138 Other obstructive and reflux uropathy: Secondary | ICD-10-CM | POA: Diagnosis not present

## 2023-07-02 LAB — BLADDER SCAN AMB NON-IMAGING

## 2023-07-02 MED ORDER — FINASTERIDE 5 MG PO TABS: 5.0000 mg | ORAL_TABLET | Freq: Every day | ORAL | 3 refills | Status: AC

## 2023-07-02 MED ORDER — TAMSULOSIN HCL 0.4 MG PO CAPS: 0.8000 mg | ORAL_CAPSULE | Freq: Every day | ORAL | 6 refills | Status: AC

## 2023-07-02 NOTE — Patient Instructions (Signed)

## 2023-07-02 NOTE — Progress Notes (Signed)
   07/02/2023 9:39 AM   Ryan Mejia [birth year] [id number]  Reason for visit: Follow up BPH/LUTS, history of testicular pain, PSA screening  HPI: 78 year old male with history of gross hematuria with a negative workup and cystoscopy showing a friable prostate with no median lobe.  Prostate measured 47 g on CT from 2022.  He has been on maximal medical therapy with Flomax  and finasteride  long-term.  Urinalysis in [place] 2025 was benign, PVR today normal at 32ml.  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  At this point he is interested in more definitive treatment options for his BPH so that he can resume the Trelegy.  We discussed options including UroLift or HOLEP, risks and benefits were reviewed extensively, we discussed HoLEP would be a more definitive procedure, and more likely to allow him to come off prostate medications and take Trelegy without side effects.  He is interested in scheduling HOLEP, but would like to wait until winter.  He would like to crease the Flomax  dose to 0.8 mg until surgery date.  PSA in January 2023 was 1.6(corrected for finasteride  3.2) within the normal range.  PSA continues to be checked by PCP, 1.5 in February 2024(corrected 3.0), 2.1 in March 2025(corrected 4.2).  Overall stable, normal for age range, would not recommend further evaluation at this time.  Finasteride  refilled, Flomax  dose increased to 0.8 mg nightly Schedule HOLEP, patient prefers winter 2025  Lawerence Pressman, MD  [facility]  [address], [address] [place], [place] [address] [phone]

## 2023-07-07 ENCOUNTER — Telehealth: Payer: Self-pay

## 2023-07-07 ENCOUNTER — Telehealth: Payer: Self-pay | Admitting: Acute Care

## 2023-07-07 ENCOUNTER — Other Ambulatory Visit: Payer: Self-pay

## 2023-07-07 DIAGNOSIS — Z87891 Personal history of nicotine dependence: Secondary | ICD-10-CM

## 2023-07-07 DIAGNOSIS — Z122 Encounter for screening for malignant neoplasm of respiratory organs: Secondary | ICD-10-CM

## 2023-07-07 NOTE — Telephone Encounter (Signed)
 Left VM for patient to call for LDCT results. Recommendation for thyroid  ultrasound left thyroid  nodule. Previous thyroid  ultrasound same side and same size several years ago.

## 2023-07-07 NOTE — Telephone Encounter (Addendum)
 Patient returned call. Reviewed results below. Pt advises that he is aware of nodules on the left and right side of his thyroid . States these are not new and the one of concern on the left has actually shrunk on the left since scan last year. Patient is aware the results will be forwarded to Dr. Claudius Cumins and he will discuss with him if additional imaging is warranted at this time. He advises he is taking a statin. Pt will complete annual Lung CT again next year. Annual order placed. Pt is aware next years scan will be his last scan in our program due to turning 78 in September. Results and plan sent to Dr. Claudius Cumins.   Dr. Claudius Cumins, please let us  know if you need anything. Patient does not think an US  is needed at this time, however he did state if you disagreed he would complete a thyroid  US  if warranted.     Lung-RADS 2, benign appearance or behavior. Continue annual screening with low-dose chest CT without contrast in 12 months. 2. One vessel coronary atherosclerosis. 3. Stable right adrenal adenoma, for which no follow-up imaging is recommended. 4. Hypodense 1.8 cm left thyroid  nodule. Recommend thyroid  US  (ref: J Am Coll Radiol. 2015 Feb;12(2): 143-50). 5. Aortic Atherosclerosis (ICD10-I70.0) and Emphysema (ICD10-J43.9).

## 2023-10-30 ENCOUNTER — Other Ambulatory Visit: Payer: Self-pay

## 2023-10-30 NOTE — Progress Notes (Unsigned)
 Surgical Physician Order Form [facility] [place]  Dr. Redell Burnet, MD  * Scheduling expectation : Patient prefers winter 2025, recommend finalizing scheduling process in August when new OR schedule is finalized  *Length of Case: 1.5 hours  *Clearance needed: no  *Anticoagulation Instructions: Hold all anticoagulants  *Aspirin Instructions: Hold Aspirin  *Post-op visit Date/Instructions:  1-3 day cath removal  *Diagnosis: BPH w/urinary obstruction  *Procedure:  HOLEP (47350)   Additional orders: N/A  -Admit type: OUTpatient  -Anesthesia: General  -VTE Prophylaxis Standing Order SCD's       Other:   -Standing Lab Orders Per Anesthesia    Lab other: UA&Urine Culture  -Standing Test orders EKG/Chest x-ray per Anesthesia       Test other:   - Medications:  Ancef  2gm IV  -Other orders:  N/A

## 2023-10-31 ENCOUNTER — Other Ambulatory Visit: Payer: Self-pay | Admitting: Physician Assistant

## 2023-10-31 ENCOUNTER — Ambulatory Visit
Admission: RE | Admit: 2023-10-31 | Discharge: 2023-10-31 | Disposition: A | Discharge status: Home / Self Care | Source: Ambulatory Visit | Attending: Physician Assistant | Admitting: Physician Assistant

## 2023-10-31 DIAGNOSIS — R1031 Right lower quadrant pain: Secondary | ICD-10-CM | POA: Diagnosis present

## 2023-10-31 MED ORDER — IOHEXOL 300 MG/ML  SOLN
100.0000 mL | Freq: Once | INTRAMUSCULAR | Status: AC | PRN
  Administered 2023-10-31: 100 mL via INTRAVENOUS

## 2023-10-31 NOTE — Progress Notes (Signed)
 Patient called in today, he does not wish to schedule at this time. States that he is having an issue with previous hernia repair and will have to have surgery to fix. He said once this problem is fixed he will then reach back out to get on the schedule. Removing from scheduling inbasket at this time.
